# Patient Record
Sex: Male | Born: 1970 | Race: White | Hispanic: No | Marital: Single | Smoking: Former smoker
Health system: Southern US, Community
[De-identification: ages and names within clinical notes are randomized; demographics above are authoritative.]

## PROBLEM LIST (undated history)

## (undated) DIAGNOSIS — F32A Depression, unspecified: Secondary | ICD-10-CM

## (undated) DIAGNOSIS — F419 Anxiety disorder, unspecified: Secondary | ICD-10-CM

## (undated) DIAGNOSIS — A159 Respiratory tuberculosis unspecified: Secondary | ICD-10-CM

## (undated) DIAGNOSIS — M109 Gout, unspecified: Secondary | ICD-10-CM

## (undated) DIAGNOSIS — T7840XA Allergy, unspecified, initial encounter: Secondary | ICD-10-CM

## (undated) DIAGNOSIS — M1990 Unspecified osteoarthritis, unspecified site: Secondary | ICD-10-CM

## (undated) DIAGNOSIS — I10 Essential (primary) hypertension: Secondary | ICD-10-CM

## (undated) DIAGNOSIS — F329 Major depressive disorder, single episode, unspecified: Secondary | ICD-10-CM

## (undated) HISTORY — DX: Respiratory tuberculosis unspecified: A15.9

## (undated) HISTORY — PX: FRACTURE SURGERY: SHX138

## (undated) HISTORY — DX: Anxiety disorder, unspecified: F41.9

## (undated) HISTORY — DX: Allergy, unspecified, initial encounter: T78.40XA

## (undated) HISTORY — DX: Unspecified osteoarthritis, unspecified site: M19.90

---

## 2009-01-17 ENCOUNTER — Ambulatory Visit: Payer: Self-pay | Admitting: Family Medicine

## 2009-01-17 DIAGNOSIS — F411 Generalized anxiety disorder: Secondary | ICD-10-CM | POA: Insufficient documentation

## 2009-01-17 DIAGNOSIS — F1020 Alcohol dependence, uncomplicated: Secondary | ICD-10-CM | POA: Insufficient documentation

## 2009-01-17 DIAGNOSIS — I10 Essential (primary) hypertension: Secondary | ICD-10-CM | POA: Insufficient documentation

## 2009-01-23 ENCOUNTER — Telehealth: Payer: Self-pay | Admitting: Family Medicine

## 2009-01-23 ENCOUNTER — Ambulatory Visit: Payer: Self-pay | Admitting: Family Medicine

## 2009-02-13 ENCOUNTER — Ambulatory Visit: Payer: Self-pay | Admitting: Family Medicine

## 2009-03-22 ENCOUNTER — Telehealth: Payer: Self-pay | Admitting: Family Medicine

## 2010-03-27 NOTE — Progress Notes (Signed)
Summary: Atenolol and Clonazepam refill  Phone Note Refill Request Message from:  Patient on March 22, 2009 9:20 AM  Refills Requested: Medication #1:  ATENOLOL 50 MG TABS 1 tab by mouth once daily.  Medication #2:  CLONAZEPAM 1 MG TABS 1.5 tab by mouth at bedtime Initial call taken by: Payton Spark CMA,  March 22, 2009 9:20 AM    Prescriptions: CLONAZEPAM 1 MG TABS (CLONAZEPAM) 1.5 tab by mouth at bedtime  #45 x 0   Entered and Authorized by:   Seymour Bars DO   Signed by:   Seymour Bars DO on 03/22/2009   Method used:   Printed then faxed to ...       [address] [phone]* (retail)       [address] [place], [place]  [address]       Ph: [phone]       Fax: [phone]   RxID:   [id number] ATENOLOL 50 MG TABS (ATENOLOL) 1 tab by mouth once daily  #30 x 1   Entered and Authorized by:   Seymour Bars DO   Signed by:   Seymour Bars DO on 03/22/2009   Method used:   Printed then faxed to ...       [address] [phone]* (retail)       [address] [place], [place]  [address]       Ph: [phone]       Fax: [phone]   RxID:   [id number]

## 2017-09-05 ENCOUNTER — Encounter (HOSPITAL_COMMUNITY): Payer: Self-pay | Admitting: Emergency Medicine

## 2017-09-05 ENCOUNTER — Emergency Department (HOSPITAL_COMMUNITY)
Admission: EM | Admit: 2017-09-05 | Discharge: 2017-09-06 | Disposition: A | Discharge status: Other Acute Inpatient Hospital | Payer: Self-pay | Attending: Emergency Medicine | Admitting: Emergency Medicine

## 2017-09-05 DIAGNOSIS — R45851 Suicidal ideations: Secondary | ICD-10-CM | POA: Insufficient documentation

## 2017-09-05 DIAGNOSIS — F411 Generalized anxiety disorder: Secondary | ICD-10-CM | POA: Insufficient documentation

## 2017-09-05 DIAGNOSIS — F322 Major depressive disorder, single episode, severe without psychotic features: Secondary | ICD-10-CM | POA: Insufficient documentation

## 2017-09-05 DIAGNOSIS — I10 Essential (primary) hypertension: Secondary | ICD-10-CM | POA: Insufficient documentation

## 2017-09-05 HISTORY — DX: Gout, unspecified: M10.9

## 2017-09-05 HISTORY — DX: Essential (primary) hypertension: I10

## 2017-09-05 NOTE — ED Triage Notes (Addendum)
Pt arrives with police. Not speaking much in triage. Voluntary, suicidal at this time - reports feels this way for "some time." States "my life keeps getting worse and worse." Police report family/finance issues. States that he wants to drive his motorbike off an overpass but "I don't have the stones to do it."

## 2017-09-05 NOTE — ED Notes (Signed)
Pt belonging placed in locker 6 in pod F

## 2017-09-05 NOTE — ED Notes (Signed)
Pt changed into burgundy scrubs.  Staffing office notified of BH sitter need.  Belongings placed in locker by Robin, EMT.  Charge RN aware of pt.  Security at triage to wand pt. 

## 2017-09-05 NOTE — ED Notes (Signed)
Provided burgundy scrubs at this time, pt getting changed. Police at bedside

## 2017-09-06 ENCOUNTER — Other Ambulatory Visit: Payer: Self-pay

## 2017-09-06 ENCOUNTER — Encounter (HOSPITAL_COMMUNITY): Payer: Self-pay | Admitting: Nurse Practitioner

## 2017-09-06 ENCOUNTER — Inpatient Hospital Stay (HOSPITAL_COMMUNITY)
Admission: AD | Admit: 2017-09-06 | Discharge: 2017-09-15 | DRG: 885 | Disposition: A | Discharge status: Home / Self Care | Payer: Federal, State, Local not specified - Other | Source: Intra-hospital | Attending: Psychiatry | Admitting: Psychiatry

## 2017-09-06 DIAGNOSIS — I10 Essential (primary) hypertension: Secondary | ICD-10-CM | POA: Diagnosis present

## 2017-09-06 DIAGNOSIS — G4700 Insomnia, unspecified: Secondary | ICD-10-CM | POA: Diagnosis present

## 2017-09-06 DIAGNOSIS — T43215A Adverse effect of selective serotonin and norepinephrine reuptake inhibitors, initial encounter: Secondary | ICD-10-CM | POA: Diagnosis not present

## 2017-09-06 DIAGNOSIS — R5383 Other fatigue: Secondary | ICD-10-CM | POA: Diagnosis not present

## 2017-09-06 DIAGNOSIS — Y92230 Patient room in hospital as the place of occurrence of the external cause: Secondary | ICD-10-CM | POA: Diagnosis not present

## 2017-09-06 DIAGNOSIS — Z818 Family history of other mental and behavioral disorders: Secondary | ICD-10-CM | POA: Diagnosis not present

## 2017-09-06 DIAGNOSIS — Z599 Problem related to housing and economic circumstances, unspecified: Secondary | ICD-10-CM | POA: Diagnosis not present

## 2017-09-06 DIAGNOSIS — Z62819 Personal history of unspecified abuse in childhood: Secondary | ICD-10-CM | POA: Diagnosis present

## 2017-09-06 DIAGNOSIS — M79674 Pain in right toe(s): Secondary | ICD-10-CM | POA: Diagnosis not present

## 2017-09-06 DIAGNOSIS — F322 Major depressive disorder, single episode, severe without psychotic features: Secondary | ICD-10-CM | POA: Diagnosis present

## 2017-09-06 DIAGNOSIS — F4310 Post-traumatic stress disorder, unspecified: Secondary | ICD-10-CM | POA: Diagnosis present

## 2017-09-06 DIAGNOSIS — Z560 Unemployment, unspecified: Secondary | ICD-10-CM

## 2017-09-06 DIAGNOSIS — M109 Gout, unspecified: Secondary | ICD-10-CM | POA: Diagnosis present

## 2017-09-06 DIAGNOSIS — R45851 Suicidal ideations: Secondary | ICD-10-CM | POA: Diagnosis present

## 2017-09-06 DIAGNOSIS — R42 Dizziness and giddiness: Secondary | ICD-10-CM | POA: Diagnosis not present

## 2017-09-06 DIAGNOSIS — Z91018 Allergy to other foods: Secondary | ICD-10-CM

## 2017-09-06 DIAGNOSIS — K219 Gastro-esophageal reflux disease without esophagitis: Secondary | ICD-10-CM | POA: Diagnosis present

## 2017-09-06 DIAGNOSIS — Z91030 Bee allergy status: Secondary | ICD-10-CM | POA: Diagnosis not present

## 2017-09-06 DIAGNOSIS — R52 Pain, unspecified: Secondary | ICD-10-CM

## 2017-09-06 DIAGNOSIS — F411 Generalized anxiety disorder: Secondary | ICD-10-CM | POA: Diagnosis present

## 2017-09-06 DIAGNOSIS — F332 Major depressive disorder, recurrent severe without psychotic features: Secondary | ICD-10-CM | POA: Diagnosis present

## 2017-09-06 DIAGNOSIS — F1020 Alcohol dependence, uncomplicated: Secondary | ICD-10-CM | POA: Diagnosis present

## 2017-09-06 LAB — COMPREHENSIVE METABOLIC PANEL
ALBUMIN: 4.7 g/dL (ref 3.5–5.0)
ALK PHOS: 90 U/L (ref 38–126)
ALT: 63 U/L — AB (ref 0–44)
AST: 38 U/L (ref 15–41)
Anion gap: 11 (ref 5–15)
BUN: 9 mg/dL (ref 6–20)
CALCIUM: 9.5 mg/dL (ref 8.9–10.3)
CHLORIDE: 106 mmol/L (ref 98–111)
CO2: 24 mmol/L (ref 22–32)
CREATININE: 1.14 mg/dL (ref 0.61–1.24)
GFR calc non Af Amer: >60 mL/min (ref >60)
GLUCOSE: 109 mg/dL — AB (ref 70–99)
Potassium: 3.7 mmol/L (ref 3.5–5.1)
SODIUM: 141 mmol/L (ref 135–145)
Total Bilirubin: 1.6 mg/dL — ABNORMAL HIGH (ref 0.3–1.2)
Total Protein: 7.3 g/dL (ref 6.5–8.1)

## 2017-09-06 LAB — RAPID URINE DRUG SCREEN, HOSP PERFORMED
AMPHETAMINES: NOT DETECTED
Benzodiazepines: NOT DETECTED
COCAINE: NOT DETECTED
OPIATES: NOT DETECTED
TETRAHYDROCANNABINOL: NOT DETECTED

## 2017-09-06 LAB — CBC
HEMATOCRIT: 44.5 % (ref 39.0–52.0)
HEMOGLOBIN: 15 g/dL (ref 13.0–17.0)
MCH: 30.4 pg (ref 26.0–34.0)
MCHC: 33.7 g/dL (ref 30.0–36.0)
MCV: 90.3 fL (ref 78.0–100.0)
Platelets: 197 10*3/uL (ref 150–400)
RBC: 4.93 MIL/uL (ref 4.22–5.81)
RDW: 12.8 % (ref 11.5–15.5)
WBC: 7 10*3/uL (ref 4.0–10.5)

## 2017-09-06 LAB — SALICYLATE LEVEL

## 2017-09-06 LAB — ETHANOL: Alcohol, Ethyl (B): <10 mg/dL (ref <10)

## 2017-09-06 LAB — ACETAMINOPHEN LEVEL

## 2017-09-06 MED ORDER — ADULT MULTIVITAMIN W/MINERALS CH
1.0000 | ORAL_TABLET | Freq: Every day | ORAL | Status: DC
  Administered 2017-09-06 – 2017-09-14 (×8): 1 via ORAL
  Filled 2017-09-06 (×13): qty 1

## 2017-09-06 MED ORDER — CHLORDIAZEPOXIDE HCL 25 MG PO CAPS: 25.0000 mg | ORAL_CAPSULE | Freq: Four times a day (QID) | ORAL | Status: AC | PRN

## 2017-09-06 MED ORDER — HYDROXYZINE HCL 25 MG PO TABS: 25.0000 mg | ORAL_TABLET | Freq: Four times a day (QID) | ORAL | Status: DC | PRN

## 2017-09-06 MED ORDER — ONDANSETRON 4 MG PO TBDP: 4.0000 mg | ORAL_TABLET | Freq: Four times a day (QID) | ORAL | Status: AC | PRN

## 2017-09-06 MED ORDER — VITAMIN B-1 100 MG PO TABS
100.0000 mg | ORAL_TABLET | Freq: Every day | ORAL | Status: DC
  Administered 2017-09-07 – 2017-09-14 (×7): 100 mg via ORAL
  Filled 2017-09-06 (×11): qty 1

## 2017-09-06 MED ORDER — TRAZODONE HCL 50 MG PO TABS
50.0000 mg | ORAL_TABLET | Freq: Every evening | ORAL | Status: DC | PRN
  Administered 2017-09-06: 50 mg via ORAL
  Filled 2017-09-06: qty 1

## 2017-09-06 MED ORDER — HYDROXYZINE HCL 25 MG PO TABS
25.0000 mg | ORAL_TABLET | Freq: Four times a day (QID) | ORAL | Status: AC | PRN
  Administered 2017-09-08: 25 mg via ORAL
  Filled 2017-09-06: qty 1

## 2017-09-06 MED ORDER — FLUOXETINE HCL 20 MG PO CAPS
20.0000 mg | ORAL_CAPSULE | Freq: Every day | ORAL | Status: DC
  Administered 2017-09-07: 20 mg via ORAL
  Filled 2017-09-06 (×3): qty 1

## 2017-09-06 MED ORDER — LOPERAMIDE HCL 2 MG PO CAPS: 2.0000 mg | ORAL_CAPSULE | ORAL | Status: AC | PRN

## 2017-09-06 MED ORDER — LISINOPRIL 10 MG PO TABS
10.0000 mg | ORAL_TABLET | Freq: Every day | ORAL | Status: DC
  Administered 2017-09-06 – 2017-09-07 (×2): 10 mg via ORAL
  Filled 2017-09-06: qty 1
  Filled 2017-09-06: qty 2
  Filled 2017-09-06 (×3): qty 1

## 2017-09-06 NOTE — Progress Notes (Addendum)
Per Julieanne Cottonina AC, [facility] Accepting provider is Donell SievertSpencer Simon. Attending provider is [place]. Patient can arrive by 1 PM. Number for report is [phone]. LCSW has notified patient's nurse of placement plans.   Moss McKy-Sha Thedora Rings MSW, LCSW, LCAS Clinical Social Worker 09/06/2017 8:07 AM

## 2017-09-06 NOTE — Progress Notes (Signed)
Per Donell SievertSpencer Simon, PA pt is recommended for inpt treatment. EDP Dr. Judd Lienelo, MD and pt's nurse Marylene LandAngela, RN have been advised of the disposition.  [facility].  Princess BruinsAquicha Khian Remo, MSW, LCSW Therapeutic Triage Specialist  [phone]

## 2017-09-06 NOTE — Progress Notes (Signed)
The patient would only share in group that he had a good first day in the hospital. Appears to be in a good mood. His goal for tomorrow is to work on "getting things off of my chest".
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
## 2017-09-06 NOTE — ED Notes (Addendum)
Pt requested to be listed as confidential - Registration aware. Encouraging pt to eat breakfast - states is not hungry.
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
## 2017-09-06 NOTE — ED Provider Notes (Signed)
[facility] EMERGENCY DEPARTMENT Provider Note   CSN: [id number] Arrival date & time: 09/05/17  2250     History   Chief Complaint Chief Complaint  Patient presents with  . Suicidal    HPI James Fisher is a 47 y.o. male.  Patient presents to the emergency department with chief complaint of suicidal ideation.  He states that he is very depressed and does not want to be a part of this world.  He states that the things that he sees on the news or depressing and distressing to him.  He states that people are around him are dying who want to live, and he states that he does not have the courage to kill himself and he wants to die.  He denies drug or alcohol use, but does have a history of alcoholism.  He states that he has felt like this for some time.  He does not have a clear plan on how he would kill himself.  He denies hearing or seeing things.  The history is provided by the patient. No language interpreter was used.    Past Medical History:  Diagnosis Date  . Gout   . Hypertension     Patient Active Problem List   Diagnosis Date Noted  . ANXIETY DISORDER 01/17/2009  . ALCOHOLISM 01/17/2009  . ESSENTIAL HYPERTENSION, BENIGN 01/17/2009    History reviewed. No pertinent surgical history.      Home Medications    Prior to Admission medications   Not on File    Family History No family history on file.  Social History Social History   Tobacco Use  . Smoking status: Never Smoker  . Smokeless tobacco: Never Used  Substance Use Topics  . Alcohol use: Yes    Comment: 50-60 beers a week  . Drug use: Not Currently     Allergies   Bee venom   Review of Systems Review of Systems  All other systems reviewed and are negative.    Physical Exam Updated Vital Signs BP (!) 171/118 (BP Location: Right Arm)   Pulse 87   Temp 98.7 F (37.1 C) (Oral)   Resp 18   SpO2 95%   Physical Exam  Constitutional: He is oriented to person, place, and  time. He appears well-developed and well-nourished.  HENT:  Head: Normocephalic and atraumatic.  Eyes: Pupils are equal, round, and reactive to light. Conjunctivae and EOM are normal. Right eye exhibits no discharge. Left eye exhibits no discharge. No scleral icterus.  Neck: Normal range of motion. Neck supple. No JVD present.  Cardiovascular: Normal rate, regular rhythm and normal heart sounds. Exam reveals no gallop and no friction rub.  No murmur heard. Pulmonary/Chest: Effort normal and breath sounds normal. No respiratory distress. He has no wheezes. He has no rales. He exhibits no tenderness.  Abdominal: Soft. He exhibits no distension and no mass. There is no tenderness. There is no rebound and no guarding.  Musculoskeletal: Normal range of motion. He exhibits no edema or tenderness.  Neurological: He is alert and oriented to person, place, and time.  Skin: Skin is warm and dry.  Psychiatric: He has a normal mood and affect. His behavior is normal. Judgment and thought content normal.  Nursing note and vitals reviewed.    ED Treatments / Results  Labs (all labs ordered are listed, but only abnormal results are displayed) Labs Reviewed  COMPREHENSIVE METABOLIC PANEL - Abnormal; Notable for the following components:  Result Value   Glucose, Bld 109 (*)    ALT 63 (*)    Total Bilirubin 1.6 (*)    All other components within normal limits  ACETAMINOPHEN LEVEL - Abnormal; Notable for the following components:   Acetaminophen (Tylenol), Serum <10 (*)    All other components within normal limits  ETHANOL  SALICYLATE LEVEL  CBC  RAPID URINE DRUG SCREEN, HOSP PERFORMED    EKG None  Radiology No results found.  Procedures Procedures (including critical care time)  Medications Ordered in ED Medications - No data to display   Initial Impression / Assessment and Plan / ED Course  I have reviewed the triage vital signs and the nursing notes.  Pertinent labs & imaging  results that were available during my care of the patient were reviewed by me and considered in my medical decision making (see chart for details).     Patient with suicidal thoughts.  States that he feels very depressed with off the sad and unfortunate world events that he sees around him and on the news.  He states that this caused him to want to kill himself.  TTS recommends inpatient treatment.  Final Clinical Impressions(s) / ED Diagnoses   Final diagnoses:  Suicidal ideation    ED Discharge Orders    None       Roxy HorsemanBrowning, Karly Pitter, PA-C 09/06/17 0444    Azalia Bilisampos, Kevin, MD 09/06/17 [phone]

## 2017-09-06 NOTE — ED Notes (Signed)
ALL belongings - 1 labeled belongings bag - [place]. Pt aware.  
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
## 2017-09-06 NOTE — ED Notes (Addendum)
Pt voiced understanding and agreement w/tx plan - [facility] - signed consent forms - [facility], copy sent to Medical Records, and [facility]. Pt declining to eat lunch - drinking po fluids well.
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
## 2017-09-07 LAB — TSH: TSH: 2.381 u[IU]/mL (ref 0.350–4.500)

## 2017-09-07 MED ORDER — FLUOXETINE HCL 10 MG PO CAPS
10.0000 mg | ORAL_CAPSULE | Freq: Every day | ORAL | Status: DC
  Administered 2017-09-08: 10 mg via ORAL
  Filled 2017-09-07 (×2): qty 1

## 2017-09-07 MED ORDER — LISINOPRIL 5 MG PO TABS
5.0000 mg | ORAL_TABLET | Freq: Every day | ORAL | Status: DC
  Administered 2017-09-08 – 2017-09-14 (×6): 5 mg via ORAL
  Filled 2017-09-07 (×9): qty 1

## 2017-09-07 MED ORDER — TRAZODONE HCL 100 MG PO TABS
100.0000 mg | ORAL_TABLET | Freq: Every evening | ORAL | Status: DC | PRN
  Administered 2017-09-07 – 2017-09-10 (×4): 100 mg via ORAL
  Filled 2017-09-07 (×4): qty 1

## 2017-09-07 NOTE — [facility] Group Notes (Signed)
[facility] Group Notes: (Clinical Social Work)   09/07/2017      Type of Therapy:  Group Therapy   Participation Level:  Did Not Attend despite MHT prompting   Ambrose MantleMareida Grossman-Orr, LCSW 09/07/2017, 2:03 PM

## 2017-09-07 NOTE — Progress Notes (Signed)
Patient has been up and active on the unit, attended group this evening and has voiced no complaints. Patient currently denies having pain, -si/hi/a/v hall. Support and encouragement offered, safety maintained on unit, will continue to monitor.  

## 2017-09-07 NOTE — Progress Notes (Signed)
D: Patient observed in dayroom interacting with his peers.  He denies any thoughts of self harm.  Patient concerned about his BP this morning and it was retaken.  Advised him to let staff know if he experience any dizziness or lightheadedness.  Patient can be jovial at times.  He rates his depression and anxiety as a 3; hopelessness as an 8.  Patient is concerned about where he will discharge to as he cannot stay with his sister any longer.  A: Continue to monitor medication management and MD orders.  Safety checks completed every 15 minutes per protocol.  Offer support and encouragement as needed.  R: Patient is receptive to staff; his behavior is appropriate.
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
## 2017-09-07 NOTE — Progress Notes (Signed)
Writer spoke with patient 1:1. He reports being admitted to the unit this afternoon and is getting used to the routine. Writer informed him of medications available if need. He did request medication to aid with sleep later on before going to bed. He has been up in the dayroom watching tv with minimal interaction with peers. Support given and safety maintained on unit with 15 min checks

## 2017-09-07 NOTE — Progress Notes (Signed)
Patient states, "I cannot take that medication.  I don't like the way it makes me feel."  Patient was complaining about taking his nighttime medication too late and he states, "I only got 5 hours sleep."  He appears irritable and upset over his medications.
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
## 2017-09-07 NOTE — Plan of Care (Signed)
  Problem: Education: Goal: Verbalization of understanding the information provided will improve Outcome: Progressing   Problem: Coping: Goal: Ability to verbalize frustrations and anger appropriately will improve Outcome: Progressing   Problem: Activity: Goal: Sleeping patterns will improve Outcome: Not Progressing

## 2017-09-08 MED ORDER — FLUOXETINE HCL 20 MG PO CAPS
20.0000 mg | ORAL_CAPSULE | Freq: Every day | ORAL | Status: DC
  Administered 2017-09-09 – 2017-09-12 (×4): 20 mg via ORAL
  Filled 2017-09-08 (×6): qty 1

## 2017-09-08 NOTE — [facility] Group Notes (Signed)
[facility] LCSW Group Therapy Note  Date/Time: 09/08/17, 1315  Type of Therapy and Topic:  Group Therapy:  Overcoming Obstacles  Participation Level:  moderate  Description of Group:    In this group patients will be encouraged to explore what they see as obstacles to their own wellness and recovery. They will be guided to discuss their thoughts, feelings, and behaviors related to these obstacles. The group will process together ways to cope with barriers, with attention given to specific choices patients can make. Each patient will be challenged to identify changes they are motivated to make in order to overcome their obstacles. This group will be process-oriented, with patients participating in exploration of their own experiences as well as giving and receiving support and challenge from other group members.  Therapeutic Goals: 1. Patient will identify personal and current obstacles as they relate to admission. 2. Patient will identify barriers that currently interfere with their wellness or overcoming obstacles.  3. Patient will identify feelings, thought process and behaviors related to these barriers. 4. Patient will identify two changes they are willing to make to overcome these obstacles:    Summary of Patient Progress: Pt initially declined to identify any obstacles and later said that he had many of the same obstacles as had been identified by other group members and that his "list was too long".  Pt did participate in the group discussion somewhat regarding ways to take steps towards overcoming obstacles.      Therapeutic Modalities:   Cognitive Behavioral Therapy Solution Focused Therapy Motivational Interviewing Relapse Prevention Therapy  Daleen SquibbGreg Delynn Pursley, LCSW
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
## 2017-09-08 NOTE — Plan of Care (Signed)
  Problem: Coping: Goal: Ability to demonstrate self-control will improve Outcome: Progressing Note:  Pt has remained in control of his behavior tonight.

## 2017-09-08 NOTE — Progress Notes (Signed)
D: Pt was in dayroom upon initial approach.  Pt presents with anxious affect and mood.  His goal today was to "clear my head."  He reports his day "started out rough, but it got better."  Pt denies SI/HI, denies hallucinations, denies pain.  Pt has been visible in milieu interacting with peers and staff appropriately.  Pt attended evening group.    A: Introduced self to pt.  Actively listened to pt and offered support and encouragement. PRN medication administered for anxiety and sleep.  Q15 minute safety checks maintained.  R: Pt is safe on the unit.  Pt is compliant with medications.  Pt verbally contracts for safety.  Will continue to monitor and assess.

## 2017-09-08 NOTE — Tx Team (Signed)
Interdisciplinary Treatment and Diagnostic Plan Update  09/08/2017 Time of Session: 1055 James Fisher MRN: [id number]  Principal Diagnosis: <principal problem not specified>  Secondary Diagnoses: Active Problems:   MDD (major depressive disorder), severe (HCC)   Current Medications:  Current Facility-Administered Medications  Medication Dose Route Frequency Provider Last Rate Last Dose  . chlordiazePOXIDE (LIBRIUM) capsule 25 mg  25 mg Oral Q6H PRN Cobos, Fernando A, MD      . FLUoxetine (PROZAC) capsule 10 mg  10 mg Oral Daily Cobos, Rockey SituFernando A, MD   10 mg at 09/08/17 0813  . hydrOXYzine (ATARAX/VISTARIL) tablet 25 mg  25 mg Oral Q6H PRN Cobos, Fernando A, MD      . lisinopril (PRINIVIL,ZESTRIL) tablet 5 mg  5 mg Oral Daily Cobos, Rockey SituFernando A, MD   5 mg at 09/08/17 0813  . loperamide (IMODIUM) capsule 2-4 mg  2-4 mg Oral PRN Cobos, Rockey SituFernando A, MD      . multivitamin with minerals tablet 1 tablet  1 tablet Oral Daily Cobos, Rockey SituFernando A, MD   1 tablet at 09/08/17 0813  . ondansetron (ZOFRAN-ODT) disintegrating tablet 4 mg  4 mg Oral Q6H PRN Cobos, Fernando A, MD      . thiamine (VITAMIN B-1) tablet 100 mg  100 mg Oral Daily Cobos, Rockey SituFernando A, MD   100 mg at 09/08/17 0813  . traZODone (DESYREL) tablet 100 mg  100 mg Oral QHS PRN Cobos, Rockey SituFernando A, MD   100 mg at 09/07/17 2134   PTA Medications: No medications prior to admission.    Patient Stressors:    Patient Strengths:    Treatment Modalities: Medication Management, Group therapy, Case management,  1 to 1 session with clinician, Psychoeducation, Recreational therapy.   Physician Treatment Plan for Primary Diagnosis: <principal problem not specified> Long Term Goal(s): Improvement in symptoms so as ready for discharge Improvement in symptoms so as ready for discharge   Short Term Goals: Ability to identify changes in lifestyle to reduce recurrence of condition will improve Ability to maintain clinical measurements within  normal limits will improve Ability to identify changes in lifestyle to reduce recurrence of condition will improve Ability to verbalize feelings will improve Ability to disclose and discuss suicidal ideas Ability to demonstrate self-control will improve Ability to identify and develop effective coping behaviors will improve Ability to maintain clinical measurements within normal limits will improve  Medication Management: Evaluate patient's response, side effects, and tolerance of medication regimen.  Therapeutic Interventions: 1 to 1 sessions, Unit Group sessions and Medication administration.  Evaluation of Outcomes: Progressing  Physician Treatment Plan for Secondary Diagnosis: Active Problems:   MDD (major depressive disorder), severe (HCC)  Long Term Goal(s): Improvement in symptoms so as ready for discharge Improvement in symptoms so as ready for discharge   Short Term Goals: Ability to identify changes in lifestyle to reduce recurrence of condition will improve Ability to maintain clinical measurements within normal limits will improve Ability to identify changes in lifestyle to reduce recurrence of condition will improve Ability to verbalize feelings will improve Ability to disclose and discuss suicidal ideas Ability to demonstrate self-control will improve Ability to identify and develop effective coping behaviors will improve Ability to maintain clinical measurements within normal limits will improve     Medication Management: Evaluate patient's response, side effects, and tolerance of medication regimen.  Therapeutic Interventions: 1 to 1 sessions, Unit Group sessions and Medication administration.  Evaluation of Outcomes: Progressing   RN Treatment Plan for Primary Diagnosis: <principal problem  not specified> Long Term Goal(s): Knowledge of disease and therapeutic regimen to maintain health will improve  Short Term Goals: Ability to identify and develop effective  coping behaviors will improve and Compliance with prescribed medications will improve  Medication Management: RN will administer medications as ordered by provider, will assess and evaluate patient's response and provide education to patient for prescribed medication. RN will report any adverse and/or side effects to prescribing provider.  Therapeutic Interventions: 1 on 1 counseling sessions, Psychoeducation, Medication administration, Evaluate responses to treatment, Monitor vital signs and CBGs as ordered, Perform/monitor CIWA, COWS, AIMS and Fall Risk screenings as ordered, Perform wound care treatments as ordered.  Evaluation of Outcomes: Progressing   LCSW Treatment Plan for Primary Diagnosis: <principal problem not specified> Long Term Goal(s): Safe transition to appropriate next level of care at discharge, Engage patient in therapeutic group addressing interpersonal concerns.  Short Term Goals: Engage patient in aftercare planning with referrals and resources, Increase social support and Increase skills for wellness and recovery  Therapeutic Interventions: Assess for all discharge needs, 1 to 1 time with Social worker, Explore available resources and support systems, Assess for adequacy in community support network, Educate family and significant other(s) on suicide prevention, Complete Psychosocial Assessment, Interpersonal group therapy.  Evaluation of Outcomes: Progressing   Progress in Treatment: Attending groups: No. Participating in groups: No. Taking medication as prescribed: Yes. Toleration medication: Yes. Family/Significant other contact made: No, will contact:  pt declined consent Patient understands diagnosis: Yes. Discussing patient identified problems/goals with staff: Yes. Medical problems stabilized or resolved: Yes. Denies suicidal/homicidal ideation: Yes. Issues/concerns per patient self-inventory: No. Other: none  New problem(s) identified: No, Describe:   none  New Short Term/Long Term Goal(s):  Patient Goals:  "To try to figure out why things have happened in my life."  Discharge Plan or Barriers:   Reason for Continuation of Hospitalization: Depression Medication stabilization  Estimated Length of Stay: 3-5 days. Attendees: Patient:James Fisher 09/08/2017   Physician: Dr Jama Flavors, MD 09/08/2017   Nursing: Nadean Corwin, RN 09/08/2017   RN Care Manager: 09/08/2017   Social Worker: Daleen Squibb, LCSW 09/08/2017   Recreational Therapist:  09/08/2017   Other:  09/08/2017   Other:  09/08/2017   Other: 09/08/2017         Scribe for Treatment Team: Lorri Frederick, LCSW 09/08/2017 12:03 PM

## 2017-09-08 NOTE — Progress Notes (Signed)
Patient ID: [name], male   DOB: [birth year], 47 y.o.   MRN: [id number]   Patient reports that he has low energy due to poor sleep. Rated his depression 7 out of 10. Also has reported helplessness and anxiety today. Affect blunted. Denies SI and HI and AVH.  A: Patient given emotional support from RN. Patient given medications per MD orders. Patient encouraged to attend groups and unit activities. Patient encouraged to come to staff with any questions or concerns.  R: Patient remains cooperative and appropriate. Will continue to monitor patient for safety.

## 2017-09-08 NOTE — Progress Notes (Signed)
Recreation Therapy Notes  Date: 7.15.19 Time: 0930 Location: [address] Dayroom  Group Topic: Stress Management  Goal Area(s) Addresses:  Patient will verbalize importance of using healthy stress management.  Patient will identify positive emotions associated with healthy stress management.   Intervention: Stress Management  Activity :  Guided Imagery.  LRT introduced the stress management technique of guided imagery.  LRT read Fisher script that allowed patients to picture their peaceful place.  Patients were to follow along as the script was read to engage in the activity.  Education:  Stress Management, Discharge Planning.   Education Outcome: Acknowledges edcuation/In group clarification offered/Needs additional education  Clinical Observations/Feedback: Pt did not attend group.      James Fisher, LRT/CTRS         James Fisher 09/08/2017 12:40 PM 

## 2017-09-09 MED ORDER — IBUPROFEN 400 MG PO TABS
400.0000 mg | ORAL_TABLET | Freq: Four times a day (QID) | ORAL | Status: DC | PRN
  Administered 2017-09-09 (×2): 400 mg via ORAL
  Filled 2017-09-09 (×2): qty 1

## 2017-09-09 MED ORDER — HYDROXYZINE HCL 25 MG PO TABS
25.0000 mg | ORAL_TABLET | Freq: Four times a day (QID) | ORAL | Status: DC | PRN
  Administered 2017-09-09 – 2017-09-11 (×3): 25 mg via ORAL
  Filled 2017-09-09 (×2): qty 1
  Filled 2017-09-09: qty 10
  Filled 2017-09-09: qty 1
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
## 2017-09-09 NOTE — Progress Notes (Signed)
Adult Psychoeducational Group Note  Date:  09/09/2017 Time:  8:52 PM  Group Topic/Focus:  Wrap-Up Group:   The focus of this group is to help patients review their daily goal of treatment and discuss progress on daily workbooks.  Participation Level:  Active  Participation Quality:  Appropriate  Affect:  Appropriate  Cognitive:  Alert  Insight: Appropriate  Engagement in Group:  Engaged  Modes of Intervention:  Discussion  Additional Comments:  Patient stated having a good day. Patient's goal for today was to work on relationships with certain family members.  Llana Deshazo L Yechezkel Fertig 09/09/2017, 8:52 PM

## 2017-09-09 NOTE — [facility] Group Notes (Signed)
[facility] LCSW Group Therapy Note  Date/Time: 09/09/17, 1315  Type of Therapy/Topic:  Group Therapy:  Feelings about Diagnosis  Participation Level:  Active   Mood:pleasant   Description of Group:    This group will allow patients to explore their thoughts and feelings about diagnoses they have received. Patients will be guided to explore their level of understanding and acceptance of these diagnoses. Facilitator will encourage patients to process their thoughts and feelings about the reactions of others to their diagnosis, and will guide patients in identifying ways to discuss their diagnosis with significant others in their lives. This group will be process-oriented, with patients participating in exploration of their own experiences as well as giving and receiving support and challenge from other group members.   Therapeutic Goals: 1. Patient will demonstrate understanding of diagnosis as evidence by identifying two or more symptoms of the disorder:  2. Patient will be able to express two feelings regarding the diagnosis 3. Patient will demonstrate ability to communicate their needs through discussion and/or role plays  Summary of Patient Progress:Pt active in group discussion regarding symptoms of bipolar disorder, stigma, and acceptance of mental health diagnosis.  Pt made several good comments.  Good participation.        Therapeutic Modalities:   Cognitive Behavioral Therapy Brief Therapy Feelings Identification   Greg Miski Feldpausch, LCSW 

## 2017-09-09 NOTE — [facility] Group Notes (Signed)
[facility] Group Notes:  (Nursing/MHT/Case Management/Adjunct)  Date:  09/09/2017  Time:  4:03 PM  Type of Therapy:  Nurse Education  Participation Level:  Active  Participation Quality:  Appropriate  Affect:  Appropriate  Cognitive:  Appropriate  Insight:  Appropriate  Engagement in Group:  Engaged  Modes of Intervention:  Discussion, Education, Socialization and Support  Summary of Progress/Problems: Pt participated in relaxation exercise group.    Maurine SimmeringShugart, Analyah Mcconnon M 09/09/2017, 4:03 PM

## 2017-09-09 NOTE — Progress Notes (Signed)
D: Pt was in the dayroom upon initial approach.  Pt presents with anxious affect and mood.  He smiles while interacting with others.  Pt reports his day was "actually really good compared to yesterday."  His goal is "working on finding the words to mend relationships with my family right now."  Pt denies SI/HI, denies hallucinations, reports pain from headache of 5/10.  Pt has been visible in milieu interacting with peers and staff appropriately.  Pt attended evening group.    A: Introduced self to pt.  Actively listened to pt and offered support and encouragement. PRN medication administered for pain, anxiety, and sleep.  Q15 minute safety checks maintained.  R: Pt is safe on the unit.  Pt is compliant with medications.  Pt verbally contracts for safety.  Will continue to monitor and assess.

## 2017-09-09 NOTE — Progress Notes (Signed)
Data: Responses from pt's self-inventory: Depression 2/10 Anxiety 8/10 Hopelessness 8/10 Sleep- fair  Appetite-good Energy-normal Pt's goal "rest".  Action:  Orders and medications reviewed with pt. V/S and labs reviewed. Verbal support provided. Pt encouraged to attend scheduled groups. Suicide risk assessment completed. 15-minute checks performed for safety.   Response: "I haven't slept well in three days. Last night they brought in a new pt and I couldn't go back to sleep". "I really need to sleep because it's affecting my mood"."The doctor made changes to my sleep medicine yesterday and I thought it would help last night, but I was awaken from my sleep, so it's hard to tell". Pt compliant with tx plan. Pt denies any side effects to medications.

## 2017-09-09 NOTE — Plan of Care (Signed)
  Problem: Activity: Goal: Sleeping patterns will improve Outcome: Not Progressing  Pt reports difficulty sleeping for the last three days. Pt reported sleep disturbance last night after a new pt was brought into the room.

## 2017-09-09 NOTE — Progress Notes (Signed)
Recreation Therapy Notes  Animal-Assisted Activity (AAA) Program Checklist/Progress Notes Patient Eligibility Criteria Checklist & Daily Group note for Rec Tx Intervention  Date: 7.16.19 Time: 1430 [address]   AAA/T Program Assumption of Risk Form signed by Engineer, productionatient/ or Parent Legal Guardian YES   Patient is free of allergies or sever asthma YES   Patient reports no fear of animals YES   Patient reports no history of cruelty to animals YES   Patient understands his/her participation is voluntary YES   Patient washes hands before animal contact  YES  Patient washes hands after animal contact  YES   Behavioral Response: Engaged  Education: Charity fundraiserHand Washing, Appropriate Animal Interaction   Education Outcome: Acknowledges understanding/In group clarification offered/Needs additional education.   Clinical Observations/Feedback: Pt attended activity.    Caroll RancherMarjette Koven Belinsky, LRT/CTRS          Caroll RancherLindsay, La Shehan A 09/09/2017 2:59 PM

## 2017-09-10 ENCOUNTER — Encounter (HOSPITAL_COMMUNITY): Payer: Self-pay | Admitting: Behavioral Health

## 2017-09-10 NOTE — Progress Notes (Signed)
Adult Psychoeducational Group Note  Date:  09/10/2017 Time:  9:55 PM  Group Topic/Focus:  Wrap-Up Group:   The focus of this group is to help patients review their daily goal of treatment and discuss progress on daily workbooks.  Participation Level:  Active  Participation Quality:  Appropriate  Affect:  Appropriate  Cognitive:  Alert and Oriented  Insight: Appropriate  Engagement in Group:  Developing/Improving  Modes of Intervention:  Exploration and Support  Additional Comments:  Pt verbalized that his day started as a 1 but ended as a 8 or 9. Pt verbalized that something positive is being able to talk to everyone hear and bring his anxiety down.  Mouhamed Glassco, Randal Bubaerri Lee 09/10/2017, 9:55 PM

## 2017-09-10 NOTE — Therapy (Signed)
Occupational Therapy Group Note  Date:  09/10/2017 Time:  2:54 PM  Group Topic/Focus:  Stress Management  Participation Level:  Active  Participation Quality:  Appropriate  Affect:  Flat  Cognitive:  Appropriate  Insight: Improving  Engagement in Group:  Engaged  Modes of Intervention:  Activity, Discussion, Education and Socialization  Additional Comments:   S: "I like to watch youtube videos and cook"   O: Education given on stress management and healthy coping mechanisms. Pt encouraged to brainstorm with other peers and discuss what has worked in the past vs what has not. Pts further encouraged to discuss new coping stress management strategies to implement this date. Art activity made to display preferred coping mechanisms, along with incorporating the stress management outlet of coloring/art. PMR script delivered to facilitate relaxation response to help significant engagement in BADL/IADL.   A: Pt presents to group with flat affect, affect improved when discussing preferred activities. Pt brainstormed activities, needing mod-max VC's to pick 8 preferred activities. Encouraged pt to ask assistance from other peers, pt then stating watching youtube videos and cooking is his most preferred activities. Pt engaged in art activity with success. Pt enjoyed PMR script with increased relaxation response.   P: Pt provided with education on stress management activities to implement into daily routine. Handouts given to facilitate carryover when reintegrating into community.     Dalphine HandingKaylee Dyllin Gulley, MSOT, OTR/L  [place] Beach[place]lee Marijane Trower 09/10/2017, 2:54 PM

## 2017-09-10 NOTE — [facility] Group Notes (Signed)
[facility] Group Therapy 09/10/2017 1:15pm  Type of Therapy: Mental Health Association Presentation  Participation Level: Active  Participation Quality: Attentive  Affect: Appropriate  Cognitive: Oriented  Insight: Developing/Improving  Engagement in Therapy: Engaged  Modes of Intervention: Discussion, Education and Socialization  Summary of Progress/Problems: Mental Health Association (MHA) Speaker came to talk about his personal journey with mental health. The pt processed ways by which to relate to the speaker. MHA speaker provided handouts and educational information pertaining to groups and services offered by the MHA. Pt was engaged in speaker's presentation and was receptive to resources provided.    Tijuana Scheidegger Jon, LCSW 09/10/2017 12:46 PM 
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
## 2017-09-11 MED ORDER — PANTOPRAZOLE SODIUM 40 MG PO TBEC
40.0000 mg | DELAYED_RELEASE_TABLET | Freq: Every day | ORAL | Status: DC
  Administered 2017-09-11 – 2017-09-14 (×2): 40 mg via ORAL
  Filled 2017-09-11 (×5): qty 1
  Filled 2017-09-11 (×2): qty 7
  Filled 2017-09-11: qty 1

## 2017-09-11 MED ORDER — IBUPROFEN 800 MG PO TABS
800.0000 mg | ORAL_TABLET | Freq: Four times a day (QID) | ORAL | Status: DC | PRN
  Administered 2017-09-11: 800 mg via ORAL
  Filled 2017-09-11: qty 1

## 2017-09-11 MED ORDER — TRAZODONE HCL 50 MG PO TABS
50.0000 mg | ORAL_TABLET | Freq: Every evening | ORAL | Status: DC | PRN
  Administered 2017-09-11: 50 mg via ORAL
  Filled 2017-09-11: qty 1

## 2017-09-11 NOTE — Progress Notes (Signed)
Pt did not attend goals and orientation group this morning  

## 2017-09-11 NOTE — [facility] Group Notes (Signed)
[facility] LCSW Group Therapy Note  Date/Time: 09/11/17, 1315  Type of Therapy/Topic:  Group Therapy:  Balance in Life  Participation Level:  active  Description of Group:    This group will address the concept of balance and how it feels and looks when one is unbalanced. Patients will be encouraged to process areas in their lives that are out of balance, and identify reasons for remaining unbalanced. Facilitators will guide patients utilizing problem- solving interventions to address and correct the stressor making their life unbalanced. Understanding and applying boundaries will be explored and addressed for obtaining  and maintaining a balanced life. Patients will be encouraged to explore ways to assertively make their unbalanced needs known to significant others in their lives, using other group members and facilitator for support and feedback.  Therapeutic Goals: 1. Patient will identify two or more emotions or situations they have that consume much of in their lives. 2. Patient will identify signs/triggers that life has become out of balance:  3. Patient will identify two ways to set boundaries in order to achieve balance in their lives:  4. Patient will demonstrate ability to communicate their needs through discussion and/or role plays  Summary of Patient Progress:Pt shared that finances and housing is currently consuming his life and that mental/emotional and work/school are areas that need boundaries.  Good participation in the group discussion.          Therapeutic Modalities:   Cognitive Behavioral Therapy Solution-Focused Therapy Assertiveness Training  Daleen SquibbGreg Zymire Turnbo, Kentucky[place]CSW

## 2017-09-11 NOTE — Progress Notes (Signed)
[facility] MD Progress Note  09/11/2017 2:53 PM James Fisher  MRN:  [id number] Subjective: Patient is seen and examined.  Patient's 47 year old male with a past psychiatric history significant for depression, anxiety as well is alcohol use disorder.  He seen in follow-up.  He stated he feels like the Prozac is helping his mood as well as anxiety.  He also stated it was helping his irritability.  He stated he had a situation this a.m. that he would have normally gotten upset about, but he was able to control it.  He also stated that he was sleeping better, but felt as though the trazodone was causing him to have a hangover in the morning.  He missed 1 of the groups this morning, and it was because of the lethargy from the trazodone.  We discussed options today.  He denied any suicidal ideation today.  He is considering calling his sister and asking her if he is able to return to her home at least in the short run.  This is provoking anxiety in him. Principal Problem: <principal problem not specified> Diagnosis:   Patient Active Problem List   Diagnosis Date Noted  . MDD (major depressive disorder), severe (HCC) [F32.2] 09/06/2017  . ANXIETY DISORDER [F41.1] 01/17/2009  . ALCOHOLISM [F10.20] 01/17/2009  . ESSENTIAL HYPERTENSION, BENIGN [I10] 01/17/2009   Total Time spent with patient: 20 minutes  Past Psychiatric History: See admission H&P  Past Medical History:  Past Medical History:  Diagnosis Date  . Gout   . Hypertension    History reviewed. No pertinent surgical history. Family History: History reviewed. No pertinent family history. Family Psychiatric  History: See admission H&P Social History:  Social History   Substance and Sexual Activity  Alcohol Use Yes   Comment: 50-60 beers a week     Social History   Substance and Sexual Activity  Drug Use Not Currently    Social History   Socioeconomic History  . Marital status: Single    Spouse name: Not on file  . Number of children:  Not on file  . Years of education: Not on file  . Highest education level: Not on file  Occupational History  . Not on file  Social Needs  . Financial resource strain: Not on file  . Food insecurity:    Worry: Not on file    Inability: Not on file  . Transportation needs:    Medical: Not on file    Non-medical: Not on file  Tobacco Use  . Smoking status: Never Smoker  . Smokeless tobacco: Never Used  Substance and Sexual Activity  . Alcohol use: Yes    Comment: 50-60 beers a week  . Drug use: Not Currently  . Sexual activity: Not on file  Lifestyle  . Physical activity:    Days per week: Not on file    Minutes per session: Not on file  . Stress: Not on file  Relationships  . Social connections:    Talks on phone: Not on file    Gets together: Not on file    Attends religious service: Not on file    Active member of club or organization: Not on file    Attends meetings of clubs or organizations: Not on file    Relationship status: Not on file  Other Topics Concern  . Not on file  Social History Narrative  . Not on file   Additional Social History:    History of alcohol / drug use?: No history  of alcohol / drug abuse                    Sleep: Good  Appetite:  Fair  Current Medications: Current Facility-Administered Medications  Medication Dose Route Frequency Provider Last Rate Last Dose  . FLUoxetine (PROZAC) capsule 20 mg  20 mg Oral Daily Cobos, Rockey Situ, MD   20 mg at 09/11/17 0811  . hydrOXYzine (ATARAX/VISTARIL) tablet 25 mg  25 mg Oral Q6H PRN Nira Conn A, NP   25 mg at 09/10/17 2045  . ibuprofen (ADVIL,MOTRIN) tablet 400 mg  400 mg Oral Q6H PRN Armandina Stammer I, NP   400 mg at 09/09/17 2153  . lisinopril (PRINIVIL,ZESTRIL) tablet 5 mg  5 mg Oral Daily Cobos, Rockey Situ, MD   5 mg at 09/11/17 4098  . multivitamin with minerals tablet 1 tablet  1 tablet Oral Daily Cobos, Rockey Situ, MD   1 tablet at 09/11/17 [phone]  . thiamine (VITAMIN B-1) tablet  100 mg  100 mg Oral Daily Cobos, Rockey Situ, MD   100 mg at 09/11/17 4782  . traZODone (DESYREL) tablet 50 mg  50 mg Oral QHS PRN Antonieta Pert, MD        Lab Results: No results found for this or any previous visit (from the past 48 hour(s)).  Blood Alcohol level:  Lab Results  Component Value Date   ETH <10 09/05/2017    Metabolic Disorder Labs: No results found for: HGBA1C, MPG No results found for: PROLACTIN No results found for: CHOL, TRIG, HDL, CHOLHDL, VLDL, LDLCALC  Physical Findings: AIMS: Facial and Oral Movements Muscles of Facial Expression: None, normal Lips and Perioral Area: None, normal Jaw: None, normal Tongue: None, normal,Extremity Movements Upper (arms, wrists, hands, fingers): None, normal Lower (legs, knees, ankles, toes): None, normal, Trunk Movements Neck, shoulders, hips: None, normal, Overall Severity Severity of abnormal movements (highest score from questions above): None, normal Incapacitation due to abnormal movements: None, normal Patient's awareness of abnormal movements (rate only patient's report): No Awareness, Dental Status Current problems with teeth and/or dentures?: No Does patient usually wear dentures?: No  CIWA:  CIWA-Ar Total: 0 COWS:     Musculoskeletal: Strength & Muscle Tone: within normal limits Gait & Station: normal Patient leans: N/A  Psychiatric Specialty Exam: Physical Exam  Nursing note and vitals reviewed. Constitutional: He is oriented to person, place, and time. He appears well-developed and well-nourished.  HENT:  Head: Normocephalic and atraumatic.  Respiratory: Effort normal.  Neurological: He is alert and oriented to person, place, and time.    ROS  Blood pressure 124/76, pulse 78, temperature 99.2 F (37.3 C), temperature source Oral, resp. rate 18, height 6' (1.829 m), weight 83.9 kg (185 lb), SpO2 96 %.Body mass index is 25.09 kg/m.  General Appearance: Casual  Eye Contact:  Fair  Speech:  Normal  Rate  Volume:  Normal  Mood:  Anxious and Depressed  Affect:  Congruent  Thought Process:  Coherent  Orientation:  Full (Time, Place, and Person)  Thought Content:  Logical  Suicidal Thoughts:  No  Homicidal Thoughts:  No  Memory:  Immediate;   Fair Recent;   Fair Remote;   Fair  Judgement:  Intact  Insight:  Fair  Psychomotor Activity:  Normal  Concentration:  Concentration: Fair and Attention Span: Fair  Recall:  [employer] of Knowledge:  Fair  Language:  Fair  Akathisia:  Negative  Handed:  Right  AIMS (if indicated):  Assets:  Communication Skills Desire for Improvement Physical Health Resilience  ADL's:  Intact  Cognition:  WNL  Sleep:  Number of Hours: 6.75     Treatment Plan Summary: Daily contact with patient to assess and evaluate symptoms and progress in treatment, Medication management and Plan Patient is seen and examined.  Patient's 47 year old male with the above-stated past psychiatric history seen in follow-up.  He is slowly improving with the fluoxetine.  No change in that today.  He is having a hangover with the trazodone, and that will be decreased to 50 mg p.o. nightly.  His blood pressures been running a bit high, and we will adjust that medicine during the course the hospitalization.  No other changes in his medications.  Antonieta PertGreg Lawson Zakiya Sporrer, MD 09/11/2017, 2:53 PM

## 2017-09-11 NOTE — Progress Notes (Signed)
D:  James Fisher has been up and visible on the unit.  He attended evening wrap up group.  He interacts well with staff and peers.  He denied SI/HI or A/V hallucinations.  He reported his day started out bad but got better in the afternoon.  He denied any pain or discomfort and appeared to be in no physical distress. He requested vistaril for anxiety and trazodone at hs for sleep.   A:  1:1 with RN for support and encouragement.  Medications as ordered.  Q 15 minute checks maintained for safety.  Encouraged participation in group and unit activities.   R:  James Fisher remains safe on the unit.  We will continue to monitor the progress towards his goals.

## 2017-09-11 NOTE — Plan of Care (Signed)
  Problem: Activity: Goal: Sleeping patterns will improve Outcome: Progressing   Problem: Coping: Goal: Ability to verbalize frustrations and anger appropriately will improve Outcome: Progressing  James Fisher reported that he slept pretty good last night and requested the same medications.  He did report having issues with anger control and his hoping to have some medication that will help.  Encouraged him to talk with the doctor about the above.  We will continue to monitor the progress towards his goals.

## 2017-09-11 NOTE — Progress Notes (Signed)
Patient ID: James Fisher, male   DOB: 08/07/1970, 47 y.o.   MRN: [id number]  Nursing Progress Note [id number]  Data: Patient presents calm, pleasant and cooperative. Patient appears to be less irritable today. Patient complaint with scheduled medications. Patient complains of increasing pain in his R foot that he states "may be a gout flare up". Patient completed self-inventory sheet and rates depression, hopelessness, and anxiety 5,8,8 respectively. Patient rates their sleep and appetite as good/fair respectively. Patient states goal for today is to "focus on my sister and talk to her". Patient is seen attending groups and visible in the milieu. Patient currently denies SI/HI/AVH.   Action: Patient educated about and provided medication per provider's orders. Patient safety maintained with q15 min safety checks and frequent rounding. Low fall risk precautions in place. Emotional support given. 1:1 interaction and active listening provided. Patient encouraged to attend meals and groups. Patient encouraged to work on treatment plan and goals. Labs, vital signs and patient behavior monitored throughout shift.   Response: Patient agrees to come to staff if any thoughts of SI/HI develop or if patient develops intention of acting on thoughts. Patient remains safe on the unit at this time. Patient is interacting with peers appropriately on the unit. Will continue to support and monitor.

## 2017-09-12 LAB — BASIC METABOLIC PANEL
ANION GAP: 8 (ref 5–15)
BUN: 16 mg/dL (ref 6–20)
CO2: 29 mmol/L (ref 22–32)
Calcium: 9.3 mg/dL (ref 8.9–10.3)
Chloride: 105 mmol/L (ref 98–111)
Creatinine, Ser: 1.11 mg/dL (ref 0.61–1.24)
GFR calc Af Amer: >60 mL/min (ref >60)
GLUCOSE: 110 mg/dL — AB (ref 70–99)
POTASSIUM: 4 mmol/L (ref 3.5–5.1)
Sodium: 142 mmol/L (ref 135–145)

## 2017-09-12 LAB — HEPATIC FUNCTION PANEL
ALBUMIN: 4 g/dL (ref 3.5–5.0)
ALK PHOS: 80 U/L (ref 38–126)
ALT: 70 U/L — ABNORMAL HIGH (ref 0–44)
AST: 40 U/L (ref 15–41)
Bilirubin, Direct: 0.1 mg/dL (ref 0.0–0.2)
Indirect Bilirubin: 1 mg/dL — ABNORMAL HIGH (ref 0.3–0.9)
TOTAL PROTEIN: 6.6 g/dL (ref 6.5–8.1)
Total Bilirubin: 1.1 mg/dL (ref 0.3–1.2)

## 2017-09-12 LAB — SEDIMENTATION RATE: Sed Rate: 8 mm/hr (ref 0–16)

## 2017-09-12 LAB — URIC ACID: URIC ACID, SERUM: 7.4 mg/dL (ref 3.7–8.6)

## 2017-09-12 MED ORDER — HYDROXYZINE HCL 50 MG PO TABS
50.0000 mg | ORAL_TABLET | Freq: Every day | ORAL | Status: DC
  Administered 2017-09-13 – 2017-09-14 (×2): 50 mg via ORAL
  Filled 2017-09-12: qty 7
  Filled 2017-09-12 (×4): qty 1
  Filled 2017-09-12: qty 7

## 2017-09-12 MED ORDER — TRAZODONE HCL 50 MG PO TABS: 50.0000 mg | ORAL_TABLET | Freq: Every evening | ORAL | Status: DC | PRN

## 2017-09-12 MED ORDER — INDOMETHACIN 25 MG PO CAPS
25.0000 mg | ORAL_CAPSULE | Freq: Three times a day (TID) | ORAL | Status: DC | PRN
  Administered 2017-09-12: 25 mg via ORAL
  Filled 2017-09-12: qty 1

## 2017-09-12 MED ORDER — ACETAMINOPHEN 325 MG PO TABS
650.0000 mg | ORAL_TABLET | Freq: Four times a day (QID) | ORAL | Status: DC | PRN
  Administered 2017-09-12: 650 mg via ORAL
  Filled 2017-09-12: qty 2

## 2017-09-12 MED ORDER — BUSPIRONE HCL 5 MG PO TABS
5.0000 mg | ORAL_TABLET | Freq: Two times a day (BID) | ORAL | Status: DC
  Administered 2017-09-12 – 2017-09-14 (×3): 5 mg via ORAL
  Filled 2017-09-12 (×8): qty 1

## 2017-09-12 MED ORDER — FLUOXETINE HCL 20 MG PO CAPS
30.0000 mg | ORAL_CAPSULE | Freq: Every day | ORAL | Status: DC
  Administered 2017-09-14: 30 mg via ORAL
  Filled 2017-09-12 (×5): qty 1

## 2017-09-12 NOTE — Plan of Care (Signed)
  Problem: Coping: Goal: Ability to verbalize frustrations and anger appropriately will improve Outcome: Progressing   Problem: Self-Concept: Goal: Will verbalize positive feelings about self Outcome: Progressing   James Fisher reported a better day today.  He was happy that some of his peers had better days because they spoke with him.  "That made me feel good."  We will continue to monitor the progress towards his goals.

## 2017-09-12 NOTE — Progress Notes (Signed)
Recreation Therapy Notes  Date: 7.19.19 Time: 0930 Location: 300 [address] Dayroom  Group Topic: Stress Management  Goal Area(s) Addresses:  Patient will verbalize importance of using healthy stress management.  Patient will identify positive emotions associated with healthy stress management.   Intervention: Stress Management  Activity :  Mountain Meditation.  LRT introduced the stress management technique of meditation.  LRT played a played a meditation that allowed patients to take on the characteristics of a mountain.  Patients were to listen and follow along as the meditation.  Education:  Stress Management, Discharge Planning.   Education Outcome: Acknowledges edcuation/In group clarification offered/Needs additional education  Clinical Observations/Feedback: Pt did not attend group.      Kekai Geter, LRT/CTRS         Sae Handrich A 09/12/2017 11:34 AM 

## 2017-09-12 NOTE — Progress Notes (Signed)
D:  James Fisher was up and visible on the unit.  He was noted being in the day room interacting with staff and peers.  He was cheerful and joking during the interaction.  He denied SI/HI or A/V hallucinations.  He did state that during wrap up group, "4 people said there day was better after talking to me.  That made me feel good."  Gently encouraged him to make sure that he focuses oh himself during this stay and not try to work on everyone else's problem.  He did not complain of any pain during evening but when he went to lay down to sleep the right toe pain had gotten worse.  "It's worse when I lay down.  I took indomethacin when I had my last flare up."   Assessed both great toes and both were equal in size with slight more redness on the right toe.  Pain 5/10.  Notified Nira ConnJason Berry NP and new orders for ibuprofen obtained.  He was given dose along with Protonix to prevent stomach upset.  After checking back, he is resting with his eyes closed and appears to be in no physical distress.   A:  1:1 with RN for support and encouragement.  Medications as ordered.  Q 15 minute checks maintained for safety.  Encouraged participation in group and unit activities.   R:  James Fisher remains safe on the unit.  We will continue to monitor the progress towards his goals.

## 2017-09-12 NOTE — Tx Team (Signed)
Interdisciplinary Treatment and Diagnostic Plan Update  09/12/2017 Time of Session: 0955 James Fisher MRN: [id number]  Principal Diagnosis: <principal problem not specified>  Secondary Diagnoses: Active Problems:   MDD (major depressive disorder), severe (HCC)   Current Medications:  Current Facility-Administered Medications  Medication Dose Route Frequency Provider Last Rate Last Dose  . FLUoxetine (PROZAC) capsule 20 mg  20 mg Oral Daily Cobos, Rockey SituFernando A, MD   20 mg at 09/12/17 0820  . hydrOXYzine (ATARAX/VISTARIL) tablet 25 mg  25 mg Oral Q6H PRN Nira ConnBerry, Jason A, NP   25 mg at 09/11/17 2121  . hydrOXYzine (ATARAX/VISTARIL) tablet 50 mg  50 mg Oral QHS Antonieta Pertlary, Greg Lawson, MD      . indomethacin (INDOCIN) capsule 25 mg  25 mg Oral TID PRN Antonieta Pertlary, Greg Lawson, MD      . lisinopril (PRINIVIL,ZESTRIL) tablet 5 mg  5 mg Oral Daily Cobos, Rockey SituFernando A, MD   5 mg at 09/12/17 0820  . multivitamin with minerals tablet 1 tablet  1 tablet Oral Daily Cobos, Rockey SituFernando A, MD   1 tablet at 09/12/17 0820  . pantoprazole (PROTONIX) EC tablet 40 mg  40 mg Oral Daily Nira ConnBerry, Jason A, NP   40 mg at 09/11/17 2349  . thiamine (VITAMIN B-1) tablet 100 mg  100 mg Oral Daily Cobos, Rockey SituFernando A, MD   100 mg at 09/12/17 0820  . traZODone (DESYREL) tablet 50 mg  50 mg Oral QHS PRN Antonieta Pertlary, Greg Lawson, MD       PTA Medications: No medications prior to admission.    Patient Stressors:    Patient Strengths:    Treatment Modalities: Medication Management, Group therapy, Case management,  1 to 1 session with clinician, Psychoeducation, Recreational therapy.   Physician Treatment Plan for Primary Diagnosis: <principal problem not specified> Long Term Goal(s): Improvement in symptoms so as ready for discharge Improvement in symptoms so as ready for discharge   Short Term Goals: Ability to identify changes in lifestyle to reduce recurrence of condition will improve Ability to maintain clinical measurements within  normal limits will improve Ability to identify changes in lifestyle to reduce recurrence of condition will improve Ability to verbalize feelings will improve Ability to disclose and discuss suicidal ideas Ability to demonstrate self-control will improve Ability to identify and develop effective coping behaviors will improve Ability to maintain clinical measurements within normal limits will improve  Medication Management: Evaluate patient's response, side effects, and tolerance of medication regimen.  Therapeutic Interventions: 1 to 1 sessions, Unit Group sessions and Medication administration.  Evaluation of Outcomes: Progressing  Physician Treatment Plan for Secondary Diagnosis: Active Problems:   MDD (major depressive disorder), severe (HCC)  Long Term Goal(s): Improvement in symptoms so as ready for discharge Improvement in symptoms so as ready for discharge   Short Term Goals: Ability to identify changes in lifestyle to reduce recurrence of condition will improve Ability to maintain clinical measurements within normal limits will improve Ability to identify changes in lifestyle to reduce recurrence of condition will improve Ability to verbalize feelings will improve Ability to disclose and discuss suicidal ideas Ability to demonstrate self-control will improve Ability to identify and develop effective coping behaviors will improve Ability to maintain clinical measurements within normal limits will improve     Medication Management: Evaluate patient's response, side effects, and tolerance of medication regimen.  Therapeutic Interventions: 1 to 1 sessions, Unit Group sessions and Medication administration.  Evaluation of Outcomes: Progressing   RN Treatment Plan for Primary Diagnosis: <  principal problem not specified> Long Term Goal(s): Knowledge of disease and therapeutic regimen to maintain health will improve  Short Term Goals: Ability to identify and develop effective  coping behaviors will improve and Compliance with prescribed medications will improve  Medication Management: RN will administer medications as ordered by provider, will assess and evaluate patient's response and provide education to patient for prescribed medication. RN will report any adverse and/or side effects to prescribing provider.  Therapeutic Interventions: 1 on 1 counseling sessions, Psychoeducation, Medication administration, Evaluate responses to treatment, Monitor vital signs and CBGs as ordered, Perform/monitor CIWA, COWS, AIMS and Fall Risk screenings as ordered, Perform wound care treatments as ordered.  Evaluation of Outcomes: Progressing   LCSW Treatment Plan for Primary Diagnosis: <principal problem not specified> Long Term Goal(s): Safe transition to appropriate next level of care at discharge, Engage patient in therapeutic group addressing interpersonal concerns.  Short Term Goals: Engage patient in aftercare planning with referrals and resources, Increase social support and Increase skills for wellness and recovery  Therapeutic Interventions: Assess for all discharge needs, 1 to 1 time with Social worker, Explore available resources and support systems, Assess for adequacy in community support network, Educate family and significant other(s) on suicide prevention, Complete Psychosocial Assessment, Interpersonal group therapy.  Evaluation of Outcomes: Progressing   Progress in Treatment: Attending groups: Yes Participating in groups: Yes Taking medication as prescribed: Yes. Toleration medication: Yes. Family/Significant other contact made: No, will contact:  pt declined consent Patient understands diagnosis: Yes. Discussing patient identified problems/goals with staff: Yes. Medical problems stabilized or resolved: Yes. Denies suicidal/homicidal ideation: Yes. Issues/concerns per patient self-inventory: No. Other: none  New problem(s) identified: No, Describe:   none  New Short Term/Long Term Goal(s):  Patient Goals:  "To try to figure out why things have happened in my life."  Discharge Plan or Barriers:   Reason for Continuation of Hospitalization: Depression Medication stabilization  Estimated Length of Stay: 3-5 days.  Attendees: Patient: 09/12/2017   Physician: Dr. Jola Babinski, MD 09/12/2017   Nursing: Norma Fredrickson, RN 09/12/2017   RN Care Manager: 09/12/2017   Social Worker: Daleen Squibb, LCSW 09/12/2017   Recreational Therapist:  09/12/2017   Other:  09/12/2017   Other:  09/12/2017   Other: 09/12/2017        Scribe for Treatment Team: Lorri Frederick, LCSW 09/12/2017 1:01 PM

## 2017-09-12 NOTE — [facility] Suicide Risk Assessment (Signed)
[facility] INPATIENT:  Family/Significant Other Suicide Prevention Education  Suicide Prevention Education:  Education Completed; James Fisher, sister, [phone], has been identified by the patient as the family member/significant other with whom the patient will be residing, and identified as the person(s) who will aid the patient in the event of a mental health crisis (suicidal ideations/suicide attempt).  With written consent from the patient, the family member/significant other has been provided the following suicide prevention education, prior to the and/or following the discharge of the patient.  The suicide prevention education provided includes the following:  Suicide risk factors  Suicide prevention and interventions  National Suicide Hotline telephone number  [facility] assessment telephone number  [facility] Assistance 911  [facility] and/or [facility]idential Mobile Crisis [facility] telephone number  Request made of family/significant other to:  Remove weapons (e.g., guns, rifles, knives), all items previously/currently identified as safety concern.    Remove drugs/medications (over-the-counter, prescriptions, illicit drugs), all items previously/currently identified as a safety concern.  The family member/significant other verbalizes understanding of the suicide prevention education information provided.  The family member/significant other agrees to remove the items of safety concern listed above.  James Fisher, James Fisher Jon, LCSW 09/12/2017, 3:33 PM

## 2017-09-12 NOTE — Plan of Care (Signed)
Pt progressing in the following metrics  D: pt found in the dayroom interacting with others. Pt pleasant and compliant with medication pass. Pt states he slept well last night. Pt rates his pain a 6/10 in his R great toe. Pt complaining that it may be gout related. Pt rates his depression/hopelessness/anxiety a 4/7/7 out of 10 respectively. Pt states her goal for today is to call his sister and will achieve this by picking up the phone. Pt denies any si/hi/ah/vh and verbally agrees to approach staff if these become  apparent.  A: pt provided support and encouragement. Pt provided medications per protocol and standing orders. [id number] safety checks implemented and continued.  R: pt safe on the unit. Will continue to monitor.   Problem: Education: Goal: Emotional status will improve Outcome: Progressing Goal: Mental status will improve Outcome: Progressing   Problem: Activity: Goal: Interest or engagement in activities will improve Outcome: Progressing   Problem: Coping: Goal: Ability to demonstrate self-control will improve Outcome: Progressing   Problem: Safety: Goal: Periods of time without injury will increase Outcome: Progressing   Problem: Education: Goal: Knowledge of the prescribed therapeutic regimen will improve Outcome: Progressing   Problem: Activity: Goal: Interest or engagement in leisure activities will improve Outcome: Progressing Goal: Imbalance in normal sleep/wake cycle will improve Outcome: Progressing   Problem: Coping: Goal: Will verbalize feelings Outcome: Progressing   Problem: Safety: Goal: Ability to disclose and discuss suicidal ideas will improve Outcome: Progressing   Problem: Self-Concept: Goal: Level of anxiety will decrease Outcome: Progressing   Problem: Self-Concept: Goal: Ability to disclose and discuss suicidal ideas will improve Outcome: Progressing Goal: Will verbalize positive feelings about self Outcome: Progressing

## 2017-09-12 NOTE — [facility] Group Notes (Signed)
  [facility]  Date/Time: 09/12/17, 1315  Type of Therapy/Topic:  Group Therapy:  Emotion Regulation  Participation Level:  Active   Mood:pleasant  Description of Group:    The purpose of this group is to assist patients in learning to regulate negative emotions and experience positive emotions. Patients will be guided to discuss ways in which they have been vulnerable to their negative emotions. These vulnerabilities will be juxtaposed with experiences of positive emotions or situations, and patients challenged to use positive emotions to combat negative ones. Special emphasis will be placed on coping with negative emotions in conflict situations, and patients will process healthy conflict resolution skills.  Therapeutic Goals: 1. Patient will identify two positive emotions or experiences to reflect on in order to balance out negative emotions:  2. Patient will label two or more emotions that they find the most difficult to experience:  3. Patient will be able to demonstrate positive conflict resolution skills through discussion or role plays:   Summary of Patient Progress:Pt shared that anger is an emotion that is difficult for him to experience.  Pt active in group discussion regarding positive ways to handle difficult emotions.        Therapeutic Modalities:   Cognitive Behavioral Therapy Feelings Identification Dialectical Behavioral Therapy  Daleen SquibbGreg Willard Farquharson, LCSW

## 2017-09-12 NOTE — Progress Notes (Signed)
[facility] MD Progress Note  09/12/2017 1:38 PM James Fisher  MRN:  [id number] Subjective: Patient is seen and examined.  Patient is a 47 year old male with past psychiatric history significant for depression, anxiety as well as alcohol use disorder.  He is seen in follow-up.  He is a little bit more anxious this morning.  He tried to call his sister last night, but she did not answer.  He stated that his father is visiting his sister, and his father and he did not get along.  He thinks 1 of the reasons why she asked him to leave was because of the father as well as the boyfriend.  We discussed calling her again.  He realizes that from the regard of housing that that would be his best option.  He also has decided he wants to say he was sorry for some of his behaviors.  He was complaining of gouty-like pain, and his laboratories this morning were negative.  His uric acid is between 7.4 and his sedimentation rate was only 9.  He stated that his foot was hurting, and that he had taken indomethacin previously.  His creatinine is 1.11, and we discussed starting the indomethacin.  He denied any suicidal ideation, and still feels as though the medications are helping. Principal Problem: <principal problem not specified> Diagnosis:   Patient Active Problem List   Diagnosis Date Noted  . MDD (major depressive disorder), severe ([place]) [F32.2] 09/06/2017  . ANXIETY DISORDER [F41.1] 01/17/2009  . ALCOHOLISM [F10.20] 01/17/2009  . ESSENTIAL HYPERTENSION, BENIGN [I10] 01/17/2009   Total Time spent with patient: 20 minutes  Past Psychiatric History: See admission H&P  Past Medical History:  Past Medical History:  Diagnosis Date  . Gout   . Hypertension    History reviewed. No pertinent surgical history. Family History: History reviewed. No pertinent family history. Family Psychiatric  History: See admission H&P Social History:  Social History   Substance and Sexual Activity  Alcohol Use Yes   Comment: 50-60  beers a week     Social History   Substance and Sexual Activity  Drug Use Not Currently    Social History   Socioeconomic History  . Marital status: Single    Spouse name: Not on file  . Number of children: Not on file  . Years of education: Not on file  . Highest education level: Not on file  Occupational History  . Not on file  Social Needs  . Financial resource strain: Not on file  . Food insecurity:    Worry: Not on file    Inability: Not on file  . Transportation needs:    Medical: Not on file    Non-medical: Not on file  Tobacco Use  . Smoking status: Never Smoker  . Smokeless tobacco: Never Used  Substance and Sexual Activity  . Alcohol use: Yes    Comment: 50-60 beers a week  . Drug use: Not Currently  . Sexual activity: Not on file  Lifestyle  . Physical activity:    Days per week: Not on file    Minutes per session: Not on file  . Stress: Not on file  Relationships  . Social connections:    Talks on phone: Not on file    Gets together: Not on file    Attends religious service: Not on file    Active member of club or organization: Not on file    Attends meetings of clubs or organizations: Not on file  Relationship status: Not on file  Other Topics Concern  . Not on file  Social History Narrative  . Not on file   Additional Social History:    History of alcohol / drug use?: No history of alcohol / drug abuse                    Sleep: Fair  Appetite:  Fair  Current Medications: Current Facility-Administered Medications  Medication Dose Route Frequency Provider Last Rate Last Dose  . FLUoxetine (PROZAC) capsule 20 mg  20 mg Oral Daily Cobos, Myer Peer, MD   20 mg at 09/12/17 0820  . hydrOXYzine (ATARAX/VISTARIL) tablet 25 mg  25 mg Oral Q6H PRN Lindon Romp A, NP   25 mg at 09/11/17 2121  . hydrOXYzine (ATARAX/VISTARIL) tablet 50 mg  50 mg Oral QHS Sharma Covert, MD      . indomethacin (INDOCIN) capsule 25 mg  25 mg Oral TID PRN  Sharma Covert, MD      . lisinopril (PRINIVIL,ZESTRIL) tablet 5 mg  5 mg Oral Daily Cobos, Myer Peer, MD   5 mg at 09/12/17 0820  . multivitamin with minerals tablet 1 tablet  1 tablet Oral Daily Cobos, Myer Peer, MD   1 tablet at 09/12/17 0820  . pantoprazole (PROTONIX) EC tablet 40 mg  40 mg Oral Daily Lindon Romp A, NP   40 mg at 09/11/17 2349  . thiamine (VITAMIN B-1) tablet 100 mg  100 mg Oral Daily Cobos, Myer Peer, MD   100 mg at 09/12/17 0820  . traZODone (DESYREL) tablet 50 mg  50 mg Oral QHS PRN Sharma Covert, MD        Lab Results:  Results for orders placed or performed during the hospital encounter of 09/06/17 (from the past 48 hour(s))  Hepatic function panel     Status: Abnormal   Collection Time: 09/12/17  6:46 AM  Result Value Ref Range   Total Protein 6.6 6.5 - 8.1 g/dL   Albumin 4.0 3.5 - 5.0 g/dL   AST 40 15 - 41 U/L   ALT 70 (H) 0 - 44 U/L    Comment: Please note change in reference range.   Alkaline Phosphatase 80 38 - 126 U/L   Total Bilirubin 1.1 0.3 - 1.2 mg/dL   Bilirubin, Direct 0.1 0.0 - 0.2 mg/dL    Comment: Please note change in reference range.   Indirect Bilirubin 1.0 (H) 0.3 - 0.9 mg/dL    Comment: Performed at [facility], [place] [address] [place], [place] [address]  Uric acid     Status: None   Collection Time: 09/12/17  6:46 AM  Result Value Ref Range   Uric Acid, Serum 7.4 3.7 - 8.6 mg/dL    Comment: Please note change in reference range. Performed at [facility], [place] [address], [place], [place] [address]   Basic metabolic panel     Status: Abnormal   Collection Time: 09/12/17  6:46 AM  Result Value Ref Range   Sodium 142 135 - 145 mmol/L   Potassium 4.0 3.5 - 5.1 mmol/L   Chloride 105 98 - 111 mmol/L    Comment: Please note change in reference range.   CO2 29 22 - 32 mmol/L   Glucose, Bld 110 (H) 70 - 99 mg/dL    Comment: Please note change in reference range.   BUN 16 6 - 20 mg/dL     Comment: Please note  change in reference range.   Creatinine, Ser 1.11 0.61 - 1.24 mg/dL   Calcium 9.3 8.9 - 10.3 mg/dL   GFR calc non Af Amer >60 >60 mL/min   GFR calc Af Amer >60 >60 mL/min    Comment: (NOTE) The eGFR has been calculated using the CKD EPI equation. This calculation has not been validated in all clinical situations. eGFR's persistently <60 mL/min signify possible Chronic Kidney Disease.    Anion gap 8 5 - 15    Comment: Performed at [facility], [place] [address] [place], [place] [address]  Sedimentation rate     Status: None   Collection Time: 09/12/17  6:46 AM  Result Value Ref Range   Sed Rate 8 0 - 16 mm/hr    Comment: Performed at [facility], [place] [address], [place], [place] [address]    Blood Alcohol level:  Lab Results  Component Value Date   ETH <10 02/40/9735    Metabolic Disorder Labs: No results found for: HGBA1C, MPG No results found for: PROLACTIN No results found for: CHOL, TRIG, HDL, CHOLHDL, VLDL, LDLCALC  Physical Findings: AIMS: Facial and Oral Movements Muscles of Facial Expression: None, normal Lips and Perioral Area: None, normal Jaw: None, normal Tongue: None, normal,Extremity Movements Upper (arms, wrists, hands, fingers): None, normal Lower (legs, knees, ankles, toes): None, normal, Trunk Movements Neck, shoulders, hips: None, normal, Overall Severity Severity of abnormal movements (highest score from questions above): None, normal Incapacitation due to abnormal movements: None, normal Patient's awareness of abnormal movements (rate only patient's report): No Awareness, Dental Status Current problems with teeth and/or dentures?: No Does patient usually wear dentures?: No  CIWA:  CIWA-Ar Total: 0 COWS:     Musculoskeletal: Strength & Muscle Tone: within normal limits Gait & Station: normal Patient leans: N/A  Psychiatric Specialty Exam: Physical Exam  Nursing note and vitals  reviewed. Constitutional: He is oriented to person, place, and time. He appears well-developed and well-nourished.  HENT:  Head: Normocephalic and atraumatic.  Respiratory: Effort normal.  Neurological: He is alert and oriented to person, place, and time.    ROS  Blood pressure 109/74, pulse 79, temperature 97.8 F (36.6 C), temperature source Oral, resp. rate 18, height 6' (1.829 m), weight 83.9 kg (185 lb), SpO2 96 %.Body mass index is 25.09 kg/m.  General Appearance: Casual  Eye Contact:  Fair  Speech:  Normal Rate  Volume:  Normal  Mood:  Anxious  Affect:  Congruent  Thought Process:  Coherent  Orientation:  Full (Time, Place, and Person)  Thought Content:  Logical  Suicidal Thoughts:  No  Homicidal Thoughts:  No  Memory:  Immediate;   Fair Recent;   Fair Remote;   Fair  Judgement:  Intact  Insight:  Fair  Psychomotor Activity:  Increased  Concentration:  Concentration: Fair and Attention Span: Fair  Recall:  [employer] of Knowledge:  Fair  Language:  Fair  Akathisia:  Negative  Handed:  Right  AIMS (if indicated):     Assets:  Communication Skills Desire for Improvement Physical Health Resilience Talents/Skills  ADL's:  Intact  Cognition:  WNL  Sleep:  Number of Hours: 5.25     Treatment Plan Summary: Daily contact with patient to assess and evaluate symptoms and progress in treatment, Medication management and Plan Patient is seen and examined.  Patient's 47 year old male with the above-stated past psychiatric history was seen in follow-up.  He is more anxious today about his conversation  with his sister.  I am going to add BuSpar 5 mg p.o. twice daily for this.  I am also going to increase his fluoxetine to 30 mg p.o. daily.  He has complained of sleep issues and he believes the timing of his medications are part of the problem.  I have specifically asked for the trazodone to be given at 9 PM, and a bedtime dose of hydroxyzine to be given at 8 PM.  Hopefully this  will be beneficial.  I have also written for indomethacin twice daily for his pain.  Sharma Covert, MD 09/12/2017, 1:38 PM

## 2017-09-13 ENCOUNTER — Inpatient Hospital Stay (HOSPITAL_COMMUNITY)
Admission: AD | Admit: 2017-09-13 | Discharge: 2017-09-13 | Disposition: A | Discharge status: Home / Self Care | Payer: Federal, State, Local not specified - Other | Source: Intra-hospital | Attending: Psychiatry | Admitting: Psychiatry

## 2017-09-13 ENCOUNTER — Inpatient Hospital Stay (HOSPITAL_COMMUNITY): Payer: Federal, State, Local not specified - Other

## 2017-09-13 MED ORDER — TRAZODONE HCL 100 MG PO TABS
100.0000 mg | ORAL_TABLET | Freq: Every evening | ORAL | Status: DC | PRN
  Administered 2017-09-13 – 2017-09-14 (×2): 100 mg via ORAL
  Filled 2017-09-13: qty 1
  Filled 2017-09-13: qty 7
  Filled 2017-09-13: qty 1

## 2017-09-13 MED ORDER — INDOMETHACIN 25 MG PO CAPS
50.0000 mg | ORAL_CAPSULE | Freq: Three times a day (TID) | ORAL | Status: DC | PRN
  Administered 2017-09-13 – 2017-09-14 (×4): 50 mg via ORAL
  Filled 2017-09-13 (×4): qty 2

## 2017-09-13 NOTE — Progress Notes (Signed)
[facility] MD Progress Note  09/13/2017 11:02 AM James Fisher  MRN:  [id number]   Subjective:  James Fisher seen attending group session.  Patient was evaluated by NP and attending psychiatrist presents irritable and  agitated regarding gout pain.  Initially James Fisher requested to be discharge as he was denying suicidal and or  homicidal ideations.  James Fisher then expressed concerns about seeing his father after discharge. Reports guilt about his failed suicidal attempt. Patient continues to have passive suicidal ideations.  Reports his sister came for visitation on  last night however states he did not "go to well."  Patient reports feeling remorse and sadness that he caused his sister pain and regarding his failed suicide attempt. James Fisher appears to have limited insight, patient's  mood is labile and irritable.  Patient voices frustration and concerns regarding medication administration times and new roommates been admitted late at night.  Denies auditory or visual hallucinations.  Reports a fair appetite.  States she is resting well.  Per attending physician patient to continue inpatient hospitalization.  Patient was agreeable to treatment plan.  Trazodone increased for insomnia.  Support encouragement reassurance was provided.  History: Per HPI-47 year old male, reports he was contemplating suicide by driving his motorcycle into an overpass. States " I was actually driving around that intersection thinking about it for two hours, but could not do it". He decided to call 911 for help and was brought to the ED. He states he has been facing significant stressors, quit his job several weeks ago, and sister ( with whom he lives) became involved with someone, so that he needs to leave .States " I guess I just decided to ride my motorcycle for a few days, try to enjoy life a little , and then kill myself ". Endorses some neuro-vegetative symptoms, but states that beyond depression, his decision to commit suicide was based on his  current financial /housing stressors and not seeing other alternative. States " I feel like I have no options, I don't have any money, can't go back".    Principal Problem: <principal problem not specified> Diagnosis:   Patient Active Problem List   Diagnosis Date Noted  . MDD (major depressive disorder), severe ([place]) [F32.2] 09/06/2017  . ANXIETY DISORDER [F41.1] 01/17/2009  . ALCOHOLISM [F10.20] 01/17/2009  . ESSENTIAL HYPERTENSION, BENIGN [I10] 01/17/2009   Total Time spent with patient: 20 minutes  Past Psychiatric History: See admission H&P  Past Medical History:  Past Medical History:  Diagnosis Date  . Gout   . Hypertension    History reviewed. No pertinent surgical history. Family History: History reviewed. No pertinent family history. Family Psychiatric  History: See admission H&P Social History:  Social History   Substance and Sexual Activity  Alcohol Use Yes   Comment: 50-60 beers a week     Social History   Substance and Sexual Activity  Drug Use Not Currently    Social History   Socioeconomic History  . Marital status: Single    Spouse name: Not on file  . Number of children: Not on file  . Years of education: Not on file  . Highest education level: Not on file  Occupational History  . Not on file  Social Needs  . Financial resource strain: Not on file  . Food insecurity:    Worry: Not on file    Inability: Not on file  . Transportation needs:    Medical: Not on file    Non-medical: Not on file  Tobacco Use  .  Smoking status: Never Smoker  . Smokeless tobacco: Never Used  Substance and Sexual Activity  . Alcohol use: Yes    Comment: 50-60 beers a week  . Drug use: Not Currently  . Sexual activity: Not on file  Lifestyle  . Physical activity:    Days per week: Not on file    Minutes per session: Not on file  . Stress: Not on file  Relationships  . Social connections:    Talks on phone: Not on file    Gets together: Not on file     Attends religious service: Not on file    Active member of club or organization: Not on file    Attends meetings of clubs or organizations: Not on file    Relationship status: Not on file  Other Topics Concern  . Not on file  Social History Narrative  . Not on file   Additional Social History:    History of alcohol / drug use?: No history of alcohol / drug abuse                    Sleep: Fair  Appetite:  Fair  Current Medications: Current Facility-Administered Medications  Medication Dose Route Frequency Provider Last Rate Last Dose  . acetaminophen (TYLENOL) tablet 650 mg  650 mg Oral Q6H PRN Money, Lowry Ram, FNP   650 mg at 09/12/17 1823  . busPIRone (BUSPAR) tablet 5 mg  5 mg Oral BID Sharma Covert, MD   5 mg at 09/12/17 1645  . FLUoxetine (PROZAC) capsule 30 mg  30 mg Oral Daily Sharma Covert, MD      . hydrOXYzine (ATARAX/VISTARIL) tablet 25 mg  25 mg Oral Q6H PRN Lindon Romp A, NP   25 mg at 09/11/17 2121  . hydrOXYzine (ATARAX/VISTARIL) tablet 50 mg  50 mg Oral QHS Sharma Covert, MD      . indomethacin (INDOCIN) capsule 25 mg  25 mg Oral TID PRN Sharma Covert, MD   25 mg at 09/12/17 1357  . lisinopril (PRINIVIL,ZESTRIL) tablet 5 mg  5 mg Oral Daily Cobos, Myer Peer, MD   5 mg at 09/12/17 0820  . multivitamin with minerals tablet 1 tablet  1 tablet Oral Daily Cobos, Myer Peer, MD   1 tablet at 09/12/17 0820  . pantoprazole (PROTONIX) EC tablet 40 mg  40 mg Oral Daily Lindon Romp A, NP   40 mg at 09/11/17 2349  . thiamine (VITAMIN B-1) tablet 100 mg  100 mg Oral Daily Cobos, Myer Peer, MD   100 mg at 09/12/17 0820  . traZODone (DESYREL) tablet 50 mg  50 mg Oral QHS PRN Sharma Covert, MD        Lab Results:  Results for orders placed or performed during the hospital encounter of 09/06/17 (from the past 48 hour(s))  Hepatic function panel     Status: Abnormal   Collection Time: 09/12/17  6:46 AM  Result Value Ref Range   Total Protein  6.6 6.5 - 8.1 g/dL   Albumin 4.0 3.5 - 5.0 g/dL   AST 40 15 - 41 U/L   ALT 70 (H) 0 - 44 U/L    Comment: Please note change in reference range.   Alkaline Phosphatase 80 38 - 126 U/L   Total Bilirubin 1.1 0.3 - 1.2 mg/dL   Bilirubin, Direct 0.1 0.0 - 0.2 mg/dL    Comment: Please note change in reference range.   Indirect Bilirubin 1.0 (  H) 0.3 - 0.9 mg/dL    Comment: Performed at [facility], [place] [address] [place], [place] [address]  Uric acid     Status: None   Collection Time: 09/12/17  6:46 AM  Result Value Ref Range   Uric Acid, Serum 7.4 3.7 - 8.6 mg/dL    Comment: Please note change in reference range. Performed at [facility], [place] [address], [place], [place] [address]   Basic metabolic panel     Status: Abnormal   Collection Time: 09/12/17  6:46 AM  Result Value Ref Range   Sodium 142 135 - 145 mmol/L   Potassium 4.0 3.5 - 5.1 mmol/L   Chloride 105 98 - 111 mmol/L    Comment: Please note change in reference range.   CO2 29 22 - 32 mmol/L   Glucose, Bld 110 (H) 70 - 99 mg/dL    Comment: Please note change in reference range.   BUN 16 6 - 20 mg/dL    Comment: Please note change in reference range.   Creatinine, Ser 1.11 0.61 - 1.24 mg/dL   Calcium 9.3 8.9 - 10.3 mg/dL   GFR calc non Af Amer >60 >60 mL/min   GFR calc Af Amer >60 >60 mL/min    Comment: (NOTE) The eGFR has been calculated using the CKD EPI equation. This calculation has not been validated in all clinical situations. eGFR's persistently <60 mL/min signify possible Chronic Kidney Disease.    Anion gap 8 5 - 15    Comment: Performed at [facility], [place] [address], [place], [place] [address]  Sedimentation rate     Status: None   Collection Time: 09/12/17  6:46 AM  Result Value Ref Range   Sed Rate 8 0 - 16 mm/hr    Comment: Performed at [facility], [place] [address], [place], [place] [address]    Blood Alcohol level:  Lab  Results  Component Value Date   ETH <10 41/63/8453    Metabolic Disorder Labs: No results found for: HGBA1C, MPG No results found for: PROLACTIN No results found for: CHOL, TRIG, HDL, CHOLHDL, VLDL, LDLCALC  Physical Findings: AIMS: Facial and Oral Movements Muscles of Facial Expression: None, normal Lips and Perioral Area: None, normal Jaw: None, normal Tongue: None, normal,Extremity Movements Upper (arms, wrists, hands, fingers): None, normal Lower (legs, knees, ankles, toes): None, normal, Trunk Movements Neck, shoulders, hips: None, normal, Overall Severity Severity of abnormal movements (highest score from questions above): None, normal Incapacitation due to abnormal movements: None, normal Patient's awareness of abnormal movements (rate only patient's report): No Awareness, Dental Status Current problems with teeth and/or dentures?: No Does patient usually wear dentures?: No  CIWA:  CIWA-Ar Total: 2 COWS:  COWS Total Score: 2  Musculoskeletal: Strength & Muscle Tone: within normal limits Gait & Station: normal Patient leans: N/A  Psychiatric Specialty Exam: Physical Exam  Nursing note and vitals reviewed. Constitutional: He is oriented to person, place, and time. He appears well-developed and well-nourished.  HENT:  Head: Normocephalic and atraumatic.  Respiratory: Effort normal.  Neurological: He is alert and oriented to person, place, and time.    Review of Systems  Musculoskeletal: Positive for joint pain.  Psychiatric/Behavioral: Positive for depression and suicidal ideas. The patient is nervous/anxious.   All other systems reviewed and are negative.   Blood pressure (!) 143/96, pulse 76, temperature 97.8 F (36.6 C), temperature source Oral, resp. rate 18, height 6' (1.829 m), weight 83.9  kg (185 lb), SpO2 96 %.Body mass index is 25.09 kg/m.  General Appearance: Casual  Eye Contact:  Fair  Speech:  Normal Rate  Volume:  Normal  Mood:  Anxious  Affect:   Congruent  Thought Process:  Coherent  Orientation:  Full (Time, Place, and Person)  Thought Content:  Logical  Suicidal Thoughts:  No  Homicidal Thoughts:  No  Memory:  Immediate;   Fair Recent;   Fair Remote;   Fair  Judgement:  Intact  Insight:  Shallow  Psychomotor Activity:  Increased  Concentration:  Concentration: Fair and Attention Span: Fair  Recall:  [employer] of Knowledge:  Fair  Language:  Fair  Akathisia:  Negative  Handed:  Right  AIMS (if indicated):     Assets:  Communication Skills Desire for Improvement Physical Health Resilience Talents/Skills  ADL's:  Intact  Cognition:  WNL  Sleep:  Number of Hours: 6.75     Treatment Plan Summary: Daily contact with patient to assess and evaluate symptoms and progress in treatment and Medication management   Continue with current treatment plan on 09/13/2017 as listed below except were noted  Depression/Anxeity:   Continue with Buspar 5 mg BID   Continue Fluoxetine 30 mg    Increased  with Trazodone 50 mg to  100 mg for insomnia  Toe pain:  Continue in indomethacin 50 mg - Xray to be schedule   Will continue to monitor vitals ,medication compliance and treatment side effects while patient is here.   CSW will continue  working on disposition.  Patient to participate in therapeutic milieu  Derrill [place], NP 09/13/2017, 11:02 AM

## 2017-09-13 NOTE — Progress Notes (Signed)
D:  James Fisher was upset this evening after visitation.  He attended evening wrap up group and was very negative during.  After group, he reported that things aren't going the way he thought and he was angry.  Refusing to take medication and being evasive with staff.  He denied SI/HI or A/V hallucinations.  He was asked what changed since yesterday because he reported having a good day.  He responded "I don't have to tell you."  He continues to complain of right great toe pain but refused to answer pain level question.  He refused RN to assess his toe.  He was noted walking fast and angrily up the hall to his room.  He was angry because "I am going to get a roommate and they will come in at 3am and wake me up, so why do I need to take something to sleep if I am going to be woken up?"  No medications were given.  He is currently asleep in his room snoring.   A:  1:1 with RN for support and encouragement.  Medications offered as ordered.  Q 15 minute checks maintained for safety.  Encouraged participation in group and unit activities.   R:  James Fisher remains safe on the unit.  We will continue to monitor the progress towards his goals.

## 2017-09-13 NOTE — B[facility] Group Notes (Signed)
LCSW Group Therapy Note  09/13/2017    10:30-11:30am   Type of Therapy and Topic:  Group Therapy: Anger and Coping Skills  Participation Level:  Minimal   Description of Group:   In this group, patients learned how to recognize the physical, cognitive, emotional, and behavioral responses they have to anger-provoking situations.  They identified how they usually or often react when angered, and learned how healthy and unhealthy coping skills work initially, but the unhealthy ones stop working.   They analyzed how their frequently-chosen coping skill is possibly beneficial and how it is possibly unhelpful.  The group discussed a variety of healthier coping skills that could help in resolving the actual issues, as well as how to go about planning for the the possibility of future similar situations.  Therapeutic Goals: 1. Patients will identify one thing that makes them angry and how they feel emotionally and physically, what their thoughts are or tend to be in those situations, and what healthy or unhealthy coping mechanism they typically use 2. Patients will identify how their coping technique works for them, as well as how it works against them. 3. Patients will explore possible new behaviors to use in future anger situations. 4. Patients will learn that anger itself is normal and cannot be eliminated, and that healthier coping skills can assist with resolving conflict rather than worsening situations.  Summary of Patient Progress:  The patient shared only his name, then "passed" on sharing anything about himself.  Later in group he did want to challenge why he is still in the hospital and this led to a short description of the IVC versus voluntary process and CSW directed him to talk with his nurse as to whether he is eligible to sign a 72-hour discharge request.  Therapeutic Modalities:   Cognitive Behavioral Therapy Motivation Interviewing  Lynnell ChadMareida J Grossman-Orr  .

## 2017-09-13 NOTE — Plan of Care (Signed)
  Problem: Coping: Goal: Ability to verbalize frustrations and anger appropriately will improve Outcome: Not Progressing  James Fisher was angry this evening, instead of talking with staff for assistance, he chose to get angry and refuse medications.  Staff encouraged talking with staff and tried to get him to process and he stated "you are not my therapist."   We will continue to monitor the progress towards his goals.

## 2017-09-13 NOTE — Progress Notes (Signed)
Adult Psychoeducational Group Note  Date:  09/13/2017 Time:  9:29 PM  Group Topic/Focus:  Wrap-Up Group:   The focus of this group is to help patients review their daily goal of treatment and discuss progress on daily workbooks.  Participation Level:  Active  Participation Quality:  Appropriate  Affect:  Appropriate  Cognitive:  Appropriate  Insight: Appropriate  Engagement in Group:  Engaged  Modes of Intervention:  Discussion  Additional Comments: Patient attended and participated.   Montel Vanderhoof W Cathy Crounse 09/13/2017, 9:29 PM

## 2017-09-13 NOTE — Plan of Care (Addendum)
D: Patient presents agitated, hostile, non-compliant. When asked how his mood is, he stated "What do you think?" When asked why he felt that way, "Man, I got gout." He has a minor limp with right foot. Respirations even and unlabored. He refused his morning medications, but did agree to take the indomethacin this afternoon. [facility] to right foot to rule out causes of pain. His pain in right foot is 9/10. Patient denies SI/HI/AVH.  A: Patient checked q15 min, and checks reviewed. Reviewed medication changes with patient and educated on side effects. Educated patient on importance of attending group therapy sessions and educated on several coping skills. Encouarged participation in milieu through recreation therapy and attending meals with peers. Support and encouragement provided. Fluids offered. R:Patient refused his morning medication. Patient contracts for safety on the unit.

## 2017-09-14 MED ORDER — BUSPIRONE HCL 5 MG PO TABS: 5.0000 mg | ORAL_TABLET | Freq: Three times a day (TID) | ORAL | 0 refills | Status: DC

## 2017-09-14 MED ORDER — LISINOPRIL 5 MG PO TABS
5.0000 mg | ORAL_TABLET | Freq: Once | ORAL | Status: AC
  Administered 2017-09-14: 5 mg via ORAL
  Filled 2017-09-14: qty 1

## 2017-09-14 MED ORDER — LISINOPRIL 10 MG PO TABS: 10.0000 mg | ORAL_TABLET | Freq: Every day | ORAL | 0 refills | Status: DC

## 2017-09-14 MED ORDER — FLUOXETINE HCL 10 MG PO CAPS: 30.0000 mg | ORAL_CAPSULE | Freq: Every day | ORAL | 0 refills | Status: DC

## 2017-09-14 MED ORDER — LISINOPRIL 10 MG PO TABS
10.0000 mg | ORAL_TABLET | Freq: Every day | ORAL | Status: DC
  Filled 2017-09-14 (×2): qty 1
  Filled 2017-09-14 (×2): qty 7

## 2017-09-14 MED ORDER — HYDROXYZINE HCL 50 MG PO TABS: 50.0000 mg | ORAL_TABLET | Freq: Every day | ORAL | 0 refills | Status: DC

## 2017-09-14 MED ORDER — BUSPIRONE HCL 5 MG PO TABS
5.0000 mg | ORAL_TABLET | Freq: Three times a day (TID) | ORAL | Status: DC
  Administered 2017-09-14 (×2): 5 mg via ORAL
  Filled 2017-09-14: qty 21
  Filled 2017-09-14: qty 1
  Filled 2017-09-14: qty 21
  Filled 2017-09-14 (×2): qty 1
  Filled 2017-09-14: qty 21
  Filled 2017-09-14 (×2): qty 1
  Filled 2017-09-14: qty 21
  Filled 2017-09-14: qty 1
  Filled 2017-09-14 (×2): qty 21

## 2017-09-14 MED ORDER — TRAZODONE HCL 100 MG PO TABS: 100.0000 mg | ORAL_TABLET | Freq: Every evening | ORAL | 0 refills | Status: DC | PRN

## 2017-09-14 MED ORDER — SUCRALFATE 1 GM/10ML PO SUSP
1.0000 g | Freq: Once | ORAL | Status: AC
  Administered 2017-09-14: 1 g via ORAL
  Filled 2017-09-14 (×2): qty 10

## 2017-09-14 MED ORDER — HYDROXYZINE HCL 25 MG PO TABS: 25.0000 mg | ORAL_TABLET | Freq: Four times a day (QID) | ORAL | 0 refills | Status: DC | PRN

## 2017-09-14 MED ORDER — PANTOPRAZOLE SODIUM 40 MG PO TBEC: 40.0000 mg | DELAYED_RELEASE_TABLET | Freq: Every day | ORAL | 0 refills | Status: DC

## 2017-09-14 MED ORDER — INDOMETHACIN 25 MG PO CAPS: 25.0000 mg | ORAL_CAPSULE | Freq: Two times a day (BID) | ORAL | Status: DC | PRN
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
## 2017-09-14 NOTE — Progress Notes (Signed)
D.  Pt pleasant on approach, complaint of continued gout pain in right foot.  Pt states it is somewhat better than it had been and that the medication is helping.  Pt was positive for evening wrap up group, observed engaged in appropriate interaction with peers on the unit.  Pt denies SI/HI/AVH at this time.  Pt requested his scheduled Vistaril at 2000 rather than 2200 tonight.  A.  Support and encouragement offered, medication given as ordered.  R.  Pt remains safe on the unit, will continue to monitor.

## 2017-09-14 NOTE — Plan of Care (Signed)
Pt progressing in the following metrics  D: pt found in the dayroom interacting with his peers. Pt compliant with medication administration. Pt states he slept well last night. Pt rates his depression/hopelessness/anxiety a 4/8/6 out of 10 respectively. Pt rates his pain at a 3 out of 10 in his right great toe relating to his gout. Pt stated that this is subsiding now that he has his medication. Pt's goal for today is to stay calm and he will achieve this by staying positive. Pt denies any si/hi/ah/vh and verbally agrees to approach staff if these become apparent. Pt's medication was increased with his blood pressure continuing to trend high.  A: pt provided support and encouragement. Pt provided medications per protocol and standing orders. q5252m safety checks implemented and continued.  R: pt safe on the unit. Will continue to monitor.   Problem: Activity: Goal: Interest or engagement in activities will improve Outcome: Progressing Goal: Sleeping patterns will improve Outcome: Progressing   Problem: Coping: Goal: Ability to verbalize frustrations and anger appropriately will improve Outcome: Progressing Goal: Ability to demonstrate self-control will improve Outcome: Progressing   Problem: Health Behavior/Discharge Planning: Goal: Compliance with treatment plan for underlying cause of condition will improve Outcome: Progressing   Problem: Safety: Goal: Periods of time without injury will increase Outcome: Progressing   Problem: Education: Goal: Knowledge of the prescribed therapeutic regimen will improve Outcome: Progressing   Problem: Activity: Goal: Interest or engagement in leisure activities will improve Outcome: Progressing Goal: Imbalance in normal sleep/wake cycle will improve Outcome: Progressing   Problem: Coping: Goal: Will verbalize feelings Outcome: Progressing   Problem: Health Behavior/Discharge Planning: Goal: Ability to make decisions will improve Outcome:  Progressing Goal: Compliance with therapeutic regimen will improve Outcome: Progressing   Problem: Role Relationship: Goal: Will demonstrate positive changes in social behaviors and relationships Outcome: Progressing   Problem: Safety: Goal: Ability to disclose and discuss suicidal ideas will improve Outcome: Progressing   Problem: Self-Concept: Goal: Will verbalize positive feelings about self Outcome: Progressing

## 2017-09-14 NOTE — [facility] Group Notes (Signed)
[facility] LCSW Group Therapy Note  Date/Time:  09/14/2017 9:00-10:00 or 10:00-11:00AM  Type of Therapy and Topic:  Group Therapy:  Healthy and Unhealthy Supports  Participation Level:  Active   Description of Group:  Patients in this group were introduced to the idea of adding a variety of healthy supports to address the various needs in their lives.Patients discussed what additional healthy supports could be helpful in their recovery and wellness after discharge in order to prevent future hospitalizations.   An emphasis was placed on using counselor, doctor, therapy groups, 12-step groups, and problem-specific support groups to expand supports.  They also worked as a group on developing a specific plan for several patients to deal with unhealthy supports through boundary-setting, psychoeducation with loved ones, and even termination of relationships.   Therapeutic Goals:   1)  discuss importance of adding supports to stay well once out of the hospital  2)  compare healthy versus unhealthy supports and identify some examples of each  3)  generate ideas and descriptions of healthy supports that can be added  4)  offer mutual support about how to address unhealthy supports  5)  encourage active participation in and adherence to discharge plan    Summary of Patient Progress:  The patient stated that current healthy supports in his life are his younger sister who loves him unconditionally while current unhealthy supports include the rest of his family, "they are scumbags and hypocrites."  The patient expressed a willingness to add some support(s) to help in his recovery journey but was unspecific.   Therapeutic Modalities:   Motivational Interviewing Brief Solution-Focused Therapy  Ambrose MantleMareida Grossman-Orr, LCSW

## 2017-09-14 NOTE — Progress Notes (Signed)
[facility]  09/14/2017 11:35 AM [facility]  MRN:  [id number] Subjective: Patient is seen and examined.  Patient is a 47 year old male with a past psychiatric history significant for depression, anxiety, alcohol use disorder.  He seen in follow-up.  He is doing better today.  He stated that he slept better.  He stated that the pain in his foot is about 90% resolved.  As stated previously, his uric acid was normal, sedimentation rate was normal.  Plain films of his foot were negative yesterday.  He stated he spoke with his sister again yesterday and that the visit went better than the night before.  He is interested as his sister goes as a go between between his father and his brother in terms of setting up a support system to help him.  He denied any suicidal ideation today.  He denied any side effects to his current medications. Principal Problem: MDD (major depressive disorder), severe (HCC) Diagnosis:   Patient Active Problem List   Diagnosis Date Noted  . MDD (major depressive disorder), severe (HCC) [F32.2] 09/06/2017  . ANXIETY DISORDER [F41.1] 01/17/2009  . ALCOHOLISM [F10.20] 01/17/2009  . ESSENTIAL HYPERTENSION, BENIGN [I10] 01/17/2009   Total Time spent with patient: 20 minutes  Past Psychiatric History: See admission H&P  Past Medical History:  Past Medical History:  Diagnosis Date  . Gout   . Hypertension    History reviewed. No pertinent surgical history. Family History: History reviewed. No pertinent family history. Family Psychiatric  History: See admission H&P Social History:  Social History   Substance and Sexual Activity  Alcohol Use Yes   Comment: 50-60 beers a week     Social History   Substance and Sexual Activity  Drug Use Not Currently    Social History   Socioeconomic History  . Marital status: Single    Spouse name: Not on file  . Number of children: Not on file  . Years of education: Not on file  . Highest education level: Not on file   Occupational History  . Not on file  Social Needs  . Financial resource strain: Not on file  . Food insecurity:    Worry: Not on file    Inability: Not on file  . Transportation needs:    Medical: Not on file    Non-medical: Not on file  Tobacco Use  . Smoking status: Never Smoker  . Smokeless tobacco: Never Used  Substance and Sexual Activity  . Alcohol use: Yes    Comment: 50-60 beers a week  . Drug use: Not Currently  . Sexual activity: Not on file  Lifestyle  . Physical activity:    Days per week: Not on file    Minutes per session: Not on file  . Stress: Not on file  Relationships  . Social connections:    Talks on phone: Not on file    Gets together: Not on file    Attends religious service: Not on file    Active member of club or organization: Not on file    Attends meetings of clubs or organizations: Not on file    Relationship status: Not on file  Other Topics Concern  . Not on file  Social History Narrative  . Not on file   Additional Social History:    History of alcohol / drug use?: No history of alcohol / drug abuse  Sleep: Good  Appetite:  Good  Current Medications: Current Facility-Administered Medications  Medication Dose Route Frequency Provider Last Rate Last Dose  . acetaminophen (TYLENOL) tablet 650 mg  650 mg Oral Q6H PRN Money, Gerlene Burdock, FNP   650 mg at 09/12/17 1823  . busPIRone (BUSPAR) tablet 5 mg  5 mg Oral BID Antonieta Pert, MD   5 mg at 09/14/17 0758  . FLUoxetine (PROZAC) capsule 30 mg  30 mg Oral Daily Antonieta Pert, MD   30 mg at 09/14/17 0758  . hydrOXYzine (ATARAX/VISTARIL) tablet 25 mg  25 mg Oral Q6H PRN Nira Conn A, NP   25 mg at 09/11/17 2121  . hydrOXYzine (ATARAX/VISTARIL) tablet 50 mg  50 mg Oral QHS Antonieta Pert, MD   50 mg at 09/13/17 2010  . indomethacin (INDOCIN) capsule 50 mg  50 mg Oral TID PRN Oneta Rack, NP   50 mg at 09/14/17 0626  . lisinopril (PRINIVIL,ZESTRIL)  tablet 5 mg  5 mg Oral Daily Cobos, Rockey Situ, MD   5 mg at 09/14/17 0758  . multivitamin with minerals tablet 1 tablet  1 tablet Oral Daily Cobos, Rockey Situ, MD   1 tablet at 09/14/17 0758  . pantoprazole (PROTONIX) EC tablet 40 mg  40 mg Oral Daily Nira Conn A, NP   40 mg at 09/14/17 0758  . thiamine (VITAMIN B-1) tablet 100 mg  100 mg Oral Daily Cobos, Rockey Situ, MD   100 mg at 09/14/17 0758  . traZODone (DESYREL) tablet 100 mg  100 mg Oral QHS PRN Oneta Rack, NP   100 mg at 09/13/17 2107    Lab Results: No results found for this or any previous visit (from the past 48 hour(s)).  Blood Alcohol level:  Lab Results  Component Value Date   ETH <10 09/05/2017    Metabolic Disorder Labs: No results found for: HGBA1C, MPG No results found for: PROLACTIN No results found for: CHOL, TRIG, HDL, CHOLHDL, VLDL, LDLCALC  Physical Findings: AIMS: Facial and Oral Movements Muscles of Facial Expression: None, normal Lips and Perioral Area: None, normal Jaw: None, normal Tongue: None, normal,Extremity Movements Upper (arms, wrists, hands, fingers): None, normal Lower (legs, knees, ankles, toes): None, normal, Trunk Movements Neck, shoulders, hips: None, normal, Overall Severity Severity of abnormal movements (highest score from questions above): None, normal Incapacitation due to abnormal movements: None, normal Patient's awareness of abnormal movements (rate only patient's report): No Awareness, Dental Status Current problems with teeth and/or dentures?: No Does patient usually wear dentures?: No  CIWA:  CIWA-Ar Total: 2 COWS:  COWS Total Score: 2  Musculoskeletal: Strength & Muscle Tone: within normal limits Gait & Station: normal Patient leans: N/A  Psychiatric Specialty Exam: Physical Exam  Nursing note and vitals reviewed. Constitutional: He is oriented to person, place, and time. He appears well-developed and well-nourished.  HENT:  Head: Normocephalic and  atraumatic.  Respiratory: Effort normal.  Neurological: He is alert and oriented to person, place, and time.    ROS  Blood pressure 133/71, pulse 76, temperature 97.8 F (36.6 C), temperature source Oral, resp. rate 18, height 6' (1.829 m), weight 83.9 kg (185 lb), SpO2 96 %.Body mass index is 25.09 kg/m.  General Appearance: Casual  Eye Contact:  Fair  Speech:  Normal Rate  Volume:  Normal  Mood:  Anxious  Affect:  Congruent  Thought Process:  Coherent  Orientation:  Full (Time, Place, and Person)  Thought Content:  Logical  Suicidal Thoughts:  No  Homicidal Thoughts:  No  Memory:  Immediate;   Fair Recent;   Fair Remote;   Fair  Judgement:  Intact  Insight:  Fair  Psychomotor Activity:  Normal  Concentration:  Concentration: Fair and Attention Span: Fair  Recall:  [employer] of Knowledge:  Fair  Language:  Good  Akathisia:  Negative  Handed:  Right  AIMS (if indicated):     Assets:  Communication Skills Desire for Improvement Housing Physical Health Resilience Social Support  ADL's:  Intact  Cognition:  WNL  Sleep:  Number of Hours: 6.75     Treatment Plan Summary: Daily contact with patient to assess and evaluate symptoms and progress in treatment, Medication management and Plan Patient is seen and examined.  Patient has a history of depression, anxiety and alcohol use disorder.  He is doing better today.  We will continue his BuSpar 5 mg but change it to 3 times a day.  His fluoxetine will remain at 30 mg p.o. daily.  He stated his pain in his foot is better and will continue the indomethacin for now.  We will also continue the lisinopril.  He felt like the trazodone was helping sleep, and none of that will be changed.  Hopefully be able to get him out of the [facility] in 1 to 2 days.  Antonieta Pert, MD 09/14/2017, 11:35 AM

## 2017-09-15 LAB — BASIC METABOLIC PANEL
Anion gap: 7 (ref 5–15)
BUN: 11 mg/dL (ref 6–20)
CALCIUM: 9.3 mg/dL (ref 8.9–10.3)
CO2: 29 mmol/L (ref 22–32)
CREATININE: 1.08 mg/dL (ref 0.61–1.24)
Chloride: 107 mmol/L (ref 98–111)
GFR calc non Af Amer: >60 mL/min (ref >60)
GLUCOSE: 112 mg/dL — AB (ref 70–99)
Potassium: 4.3 mmol/L (ref 3.5–5.1)
Sodium: 143 mmol/L (ref 135–145)

## 2017-09-15 MED ORDER — FLUOXETINE HCL 10 MG PO CAPS: 30.0000 mg | ORAL_CAPSULE | Freq: Every day | ORAL | Status: DC

## 2017-09-15 NOTE — Plan of Care (Signed)
Discharge Note  Patient verbalizes readiness for discharge. Follow up plan explained, AVS, Transition record and SRA given. Prescriptions and teaching provided. Belongings returned and signed for. Suicide safety plan completed and signed. Patient verbalizes understanding. Patient denies SI/HI and assures this Clinical research associatewriter he will seek assistance should that change. Patient discharged to lobby and was receiving a ride with another patient.  Problem: Education: Goal: Knowledge of [facility] Education information/materials will improve Outcome: Adequate for Discharge Goal: Emotional status will improve Outcome: Adequate for Discharge Goal: Mental status will improve Outcome: Adequate for Discharge Goal: Verbalization of understanding the information provided will improve Outcome: Adequate for Discharge   Problem: Activity: Goal: Interest or engagement in activities will improve Outcome: Adequate for Discharge Goal: Sleeping patterns will improve Outcome: Adequate for Discharge   Problem: Coping: Goal: Ability to verbalize frustrations and anger appropriately will improve Outcome: Adequate for Discharge Goal: Ability to demonstrate self-control will improve Outcome: Adequate for Discharge   Problem: Health Behavior/Discharge Planning: Goal: Identification of resources available to assist in meeting health care needs will improve Outcome: Adequate for Discharge Goal: Compliance with treatment plan for underlying cause of condition will improve Outcome: Adequate for Discharge   Problem: Physical Regulation: Goal: Ability to maintain clinical measurements within normal limits will improve Outcome: Adequate for Discharge   Problem: Safety: Goal: Periods of time without injury will increase Outcome: Adequate for Discharge   Problem: Education: Goal: Utilization of techniques to improve thought processes will improve Outcome: Adequate for Discharge Goal: Knowledge of the  prescribed therapeutic regimen will improve Outcome: Adequate for Discharge   Problem: Activity: Goal: Interest or engagement in leisure activities will improve Outcome: Adequate for Discharge Goal: Imbalance in normal sleep/wake cycle will improve Outcome: Adequate for Discharge   Problem: Coping: Goal: Coping ability will improve Outcome: Adequate for Discharge Goal: Will verbalize feelings Outcome: Adequate for Discharge   Problem: Health Behavior/Discharge Planning: Goal: Ability to make decisions will improve Outcome: Adequate for Discharge Goal: Compliance with therapeutic regimen will improve Outcome: Adequate for Discharge   Problem: Role Relationship: Goal: Will demonstrate positive changes in social behaviors and relationships Outcome: Adequate for Discharge   Problem: Safety: Goal: Ability to disclose and discuss suicidal ideas will improve Outcome: Adequate for Discharge Goal: Ability to identify and utilize support systems that promote safety will improve Outcome: Adequate for Discharge   Problem: Self-Concept: Goal: Will verbalize positive feelings about self Outcome: Adequate for Discharge Goal: Level of anxiety will decrease Outcome: Adequate for Discharge   Problem: Self-Concept: Goal: Ability to disclose and discuss suicidal ideas will improve Outcome: Adequate for Discharge Goal: Will verbalize positive feelings about self Outcome: Adequate for Discharge

## 2017-09-15 NOTE — Progress Notes (Signed)
  [facility] :  Will you be returning to the same living situation after discharge:  Yes,  with sister At discharge, do you have transportation home?: Yes,  friend Do you have the ability to pay for your medications: No. [facility]  Release of information consent forms completed and in the chart;  Patient's signature needed at discharge.  Patient to Follow up at: Follow-up Information    [facility] [employer] Go in 3 day(s).   Specialty:  Professional Counselor Why:  Please attend a walk in appt between 8:30am-2:30pm, Monday-Friday.  Please request therapy and medication management services. Contact information: [facility] [address] [place] [address] [phone]           Next level of care provider has access to [facility] Link:no  Safety Planning and Suicide Prevention discussed: Yes,  with sister  Have you used any form of tobacco in the last 30 days? (Cigarettes, Smokeless Tobacco, Cigars, and/or Pipes): No  Has patient been referred to the Quitline?: N/A patient is not a smoker  Patient has been referred for addiction treatment: Pt. refused referral  Lorri FrederickWierda, Aymee Fomby Jon, LCSW 09/15/2017, 11:35 AM

## 2017-09-15 NOTE — Progress Notes (Signed)
D.  Pt pleasant on approach, complaint of stomach upset.  Pt states that he usually takes Indocin in 25 mg doses twice a day.  Pt states it is tearing up his stomach.  Pt requested medication for this.  Pt was positive for evening wrap up group, observed engaged in appropriate interaction with peers on the unit.  Pt denies SI/HI/AVH at this time.  Pt's sister brought his electric beard trimmer up here at his request, but did not pack all of the parts or his charger.  Pt upset at this and requested that it be locked in his locker, this was done and placed in locker 47.    A.  PA notified of Pt's stomach upset and came out to speak with Pt.  New orders received and carried out.  Support and encouragement offered to Pt.  R.  Pt remains safe on the unit, will continue to monitor.

## 2017-09-15 NOTE — BHH Suicide Risk Assessment (Signed)
[facility]: [facility] Discharge Suicide Risk Assessment   Principal Problem: MDD (major depressive disorder), severe [facility](HCC) Discharge Diagnoses:  Patient Active Problem List   Diagnosis Date Noted  . MDD (major depressive disorder), severe (HCC) [F32.2] 09/06/2017  . ANXIETY DISORDER [F41.1] 01/17/2009  . ALCOHOLISM [F10.20] 01/17/2009  . ESSENTIAL HYPERTENSION, BENIGN [I10] 01/17/2009    Total Time spent with patient: 30 minutes  Musculoskeletal: Strength & Muscle Tone: within normal limits Gait & Station: normal Patient leans: N/A  Psychiatric Specialty Exam: Review of Systems  All other systems reviewed and are negative.   Blood pressure (!) 152/125, pulse 76, temperature 97.8 F (36.6 C), temperature source Oral, resp. rate 18, height 6' (1.829 m), weight 83.9 kg (185 lb), SpO2 96 %.Body mass index is 25.09 kg/m.  General Appearance: Casual  Eye Contact::  Fair  Speech:  Normal Rate409  Volume:  Normal  Mood:  Anxious  Affect:  Congruent  Thought Process:  Coherent  Orientation:  Full (Time, Place, and Person)  Thought Content:  Logical  Suicidal Thoughts:  No  Homicidal Thoughts:  No  Memory:  Immediate;   Fair Recent;   Fair Remote;   Fair  Judgement:  Intact  Insight:  Fair  Psychomotor Activity:  Normal  Concentration:  Fair  Recall:  [employer]  Fund of Knowledge:Fair  Language: Fair  Akathisia:  Negative  Handed:  Right  AIMS (if indicated):     Assets:  Desire for Improvement Housing Leisure Time Physical Health Resilience Social Support  Sleep:  Number of Hours: 6  Cognition: WNL  ADL's:  Intact   Mental Status Per Nursing Assessment::   On Admission:  Suicidal ideation indicated by patient  Demographic Factors:  Male, Divorced or widowed, Caucasian, Low socioeconomic status and Unemployed  Loss Factors: NA  Historical Factors: Impulsivity  Risk Reduction Factors:   Living with another person, especially a relative  Continued Clinical Symptoms:   Depression:   Impulsivity Alcohol/Substance Abuse/Dependencies  Cognitive Features That Contribute To Risk:  None    Suicide Risk:  Minimal: No identifiable suicidal ideation.  Patients presenting with no risk factors but with morbid ruminations; may be classified as minimal risk based on the severity of the depressive symptoms  Follow-up Information    [facility] in [facility] day(s).   Specialty:  Professional Counselor Why:  Please attend a walk in appt between 8:30am-2:30pm, Monday-Friday.  Please request therapy and medication management services. Contact information: [facility] [address] [place] [address] [phone]           Plan Of Care/Follow-up recommendations:  Activity:  ad lib  Antonieta PertGreg Lawson Clary, MD 09/15/2017, 7:53 AM

## 2017-09-15 NOTE — Progress Notes (Signed)
Adult Psychoeducational Group Note  Date:  09/15/2017 Time:  1:32 AM  Group Topic/Focus:  Wrap-Up Group:   The focus of this group is to help patients review their daily goal of treatment and discuss progress on daily workbooks.  Participation Level:  Active  Participation Quality:  Appropriate  Affect:  Appropriate  Cognitive:  Alert  Insight: Appropriate  Engagement in Group:  Engaged  Modes of Intervention:  Discussion  Additional Comments:  Pt achieved his goal of getting the unit to sing Happy Birthday to another pt on the hall.  Pt also enjoyed the visit from his parents today.  Pt rated the day at 9/10.  Ellajane Stong 09/15/2017, 1:32 AM

## 2017-09-23 ENCOUNTER — Emergency Department (HOSPITAL_COMMUNITY)
Admission: EM | Admit: 2017-09-23 | Discharge: 2017-09-23 | Disposition: A | Discharge status: Home / Self Care | Payer: Self-pay | Attending: Emergency Medicine | Admitting: Emergency Medicine

## 2017-09-23 ENCOUNTER — Encounter (HOSPITAL_COMMUNITY): Payer: Self-pay | Admitting: Emergency Medicine

## 2017-09-23 ENCOUNTER — Other Ambulatory Visit: Payer: Self-pay

## 2017-09-23 DIAGNOSIS — Z79899 Other long term (current) drug therapy: Secondary | ICD-10-CM | POA: Insufficient documentation

## 2017-09-23 DIAGNOSIS — F1020 Alcohol dependence, uncomplicated: Secondary | ICD-10-CM | POA: Insufficient documentation

## 2017-09-23 DIAGNOSIS — J069 Acute upper respiratory infection, unspecified: Secondary | ICD-10-CM | POA: Insufficient documentation

## 2017-09-23 DIAGNOSIS — I10 Essential (primary) hypertension: Secondary | ICD-10-CM | POA: Insufficient documentation

## 2017-09-23 DIAGNOSIS — F419 Anxiety disorder, unspecified: Secondary | ICD-10-CM | POA: Insufficient documentation

## 2017-09-23 DIAGNOSIS — F329 Major depressive disorder, single episode, unspecified: Secondary | ICD-10-CM | POA: Insufficient documentation

## 2017-09-23 MED ORDER — DEXAMETHASONE SODIUM PHOSPHATE 10 MG/ML IJ SOLN
10.0000 mg | Freq: Once | INTRAMUSCULAR | Status: AC
  Administered 2017-09-23: 10 mg via INTRAMUSCULAR
  Filled 2017-09-23: qty 1

## 2017-09-23 MED ORDER — CHLORPHENIRAMINE-PHENYLEPHRINE 4-10 MG PO TABS: 1.0000 | ORAL_TABLET | ORAL | 0 refills | Status: DC | PRN

## 2017-09-23 MED ORDER — KETOROLAC TROMETHAMINE 30 MG/ML IJ SOLN
30.0000 mg | Freq: Once | INTRAMUSCULAR | Status: AC
  Administered 2017-09-23: 30 mg via INTRAVENOUS
  Filled 2017-09-23: qty 1

## 2017-09-23 NOTE — ED Provider Notes (Signed)
[facility]ERGENCY [facility]PART[facility] Provider Note   CSN: [id number] Arrival date & time: 09/23/17  0840     History   Chief Complaint Chief Complaint  Patient presents with  . Sore Throat    HPI James Fisher is a 47 y.o. male.  HPI  Patient presents today with headache, sore throat, ear fullness. He denies sick contacts.  Denies fever but endorses chills.  He states he woke up with a sore throat last Thursday, and felt short of breath due to the feeling of fullness in his throat.  He states he looked in the mirror and felt that his tonsils were swollen.  He has been taking DayQuil and Advil since Thursday.  He states he also had decreased hearing in his left ear beginning on Sunday as well as ringing in his ear and the feeling of "water in my ear. " He also states he has been coughing up "thick green mucus."His headache feels like a pressure and squeezing sensation over bilateral temples.  He also also been taking many Excedrin Migraine per day.  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  He states this was many years ago and he has received isoniazid treatment as well.   Past Medical History:  Diagnosis Date  . Gout   . Hypertension     Patient Active Problem List   Diagnosis Date Noted  . MDD (major depressive disorder), severe (HCC) 09/06/2017  . ANXIETY DISORDER 01/17/2009  . ALCOHOLISM 01/17/2009  . ESSENTIAL HYPERTENSION, BENIGN 01/17/2009    History reviewed. No pertinent surgical history.      Home Medications    Prior to Admission medications   Medication Sig Start Date End Date Taking? Authorizing Provider  busPIRone (BUSPAR) 5 MG tablet Take 1 tablet (5 mg total) by mouth 3 (three) times daily. 09/14/17  Yes Starkes, Juel Burrowakia S, FNP  FLUoxetine (PROZAC) 10 MG capsule Take 3 capsules (30 mg total) by mouth daily. 09/15/17  Yes Starkes, Juel Burrowakia S, FNP  hydrOXYzine (ATARAX/VISTARIL) 25 MG tablet Take  1 tablet (25 mg total) by mouth every 6 (six) hours as needed for anxiety. 09/14/17  Yes Starkes, Juel Burrowakia S, FNP  hydrOXYzine (ATARAX/VISTARIL) 50 MG tablet Take 1 tablet (50 mg total) by mouth at bedtime. 09/14/17  Yes Starkes, Juel Burrowakia S, FNP  lisinopril (PRINIVIL,ZESTRIL) 10 MG tablet Take 1 tablet (10 mg total) by mouth daily. 09/15/17  Yes Starkes, Juel Burrowakia S, FNP  traZODone (DESYREL) 100 MG tablet Take 1 tablet (100 mg total) by mouth at bedtime as needed for sleep (please give at 9 PM). 09/14/17  Yes Starkes, Juel Burrowakia S, FNP  Chlorpheniramine-Phenylephrine 4-10 MG tablet Take 1 tablet by mouth every 4 (four) hours as needed for congestion. 09/23/17   Garth Bignessimberlake, Kathryn, MD  pantoprazole (PROTONIX) 40 MG tablet Take 1 tablet (40 mg total) by mouth daily. Patient not taking: Reported on 09/23/2017 09/15/17   Truman HaywardStarkes, Takia S, FNP    Family History History reviewed. No pertinent family history.  Social History Social History   Tobacco Use  . Smoking status: Never Smoker  . Smokeless tobacco: Never Used  Substance Use Topics  . Alcohol use: Yes    Comment: 50-60 beers a week  . Drug use: Not Currently     Allergies   Bee venom; Onion; and Mushroom extract complex   Review of Systems Review of Systems  Constitutional: Positive for chills. Negative for fever.  HENT: Positive  for congestion, rhinorrhea, sinus pressure, sore throat and tinnitus.   Respiratory: Positive for cough. Negative for wheezing.   Cardiovascular: Negative for chest pain.  Gastrointestinal: Negative for abdominal pain.  Skin: Negative for rash.  All other systems reviewed and are negative.    Physical Exam Updated Vital Signs BP (!) 133/94   Pulse 69   Temp 98.3 F (36.8 C)   Resp 18   Ht 6\' 1"  (1.854 m)   Wt 86.2 kg (190 lb)   SpO2 97%   BMI 25.07 kg/m   Physical Exam  Constitutional: He is oriented to person, place, and time. He appears well-developed and well-nourished. He does not appear ill. No  distress.  HENT:  Head: Normocephalic and atraumatic.  Right Ear: Tympanic membrane and ear canal normal. No middle ear effusion.  Left Ear: Tympanic membrane normal.  No middle ear effusion.  Mouth/Throat: Uvula is midline and mucous membranes are normal. Posterior oropharyngeal erythema present. No posterior oropharyngeal edema. No tonsillar exudate.  L canal mildly erythematous, patient states used Qtip prior to arrival.   Eyes: EOM are normal.  Neck: Normal range of motion. Neck supple.  Cardiovascular: Normal rate.  Pulmonary/Chest: Effort normal and breath sounds normal. He has no wheezes.  Abdominal: Soft. Bowel sounds are normal.  Neurological: He is alert and oriented to person, place, and time.  Skin: Skin is warm and dry. Capillary refill takes less than 2 seconds. No rash noted.     ED Treatments / Results  Labs (all labs ordered are listed, but only abnormal results are displayed) Labs Reviewed - No data to display  EKG None  Radiology No results found.  Procedures Procedures (including critical care time)  Medications Ordered in ED Medications  ketorolac (TORADOL) 30 MG/ML injection 30 mg (30 mg Intravenous Given 09/23/17 1020)  dexamethasone (DECADRON) injection 10 mg (10 mg Intramuscular Given 09/23/17 1020)     Initial Impression / Assessment and Plan / ED Course  I have reviewed the triage vital signs and the nursing notes.  Pertinent labs & imaging results that were available during my care of the patient were reviewed by me and considered in my medical decision making (see chart for details).     Exam consistent with viral URI with associated headache.  Does have some frontal sinus tenderness, however duration is not appropriate for acute bacterial sinusitis.  Will give Toradol and Decadron in the department for pain relief, discharged with an histamine and decongestant.  Return if worsening or further concerns  Final Clinical Impressions(s) / ED  Diagnoses   Final diagnoses:  Viral upper respiratory tract infection    ED Discharge Orders        Ordered    Chlorpheniramine-Phenylephrine 4-10 MG tablet  Every 4 hours PRN     09/23/17 0936       Garth Bigness, MD 09/23/17 1219    Gerhard Munch, MD 09/23/17 [phone]

## 2017-09-23 NOTE — Discharge Instructions (Addendum)
You have a viral infection causing your sore throat. Please use the medication we sent and return if further concerns with shortness of breath or fever. It takes 7-10 days for the average virus to resolve.

## 2017-09-23 NOTE — ED Triage Notes (Signed)
Patient complaining of sore throat, left ear pain, cough, and congestion since Saturday.

## 2017-09-24 ENCOUNTER — Encounter (HOSPITAL_COMMUNITY): Payer: Self-pay

## 2017-09-24 ENCOUNTER — Emergency Department (HOSPITAL_COMMUNITY): Payer: Self-pay

## 2017-09-24 ENCOUNTER — Other Ambulatory Visit: Payer: Self-pay

## 2017-09-24 ENCOUNTER — Inpatient Hospital Stay (HOSPITAL_COMMUNITY)
Admission: EM | Admit: 2017-09-24 | Discharge: 2017-09-29 | DRG: 603 | Disposition: A | Discharge status: Home / Self Care | Payer: Self-pay | Attending: Internal Medicine | Admitting: Internal Medicine

## 2017-09-24 DIAGNOSIS — F322 Major depressive disorder, single episode, severe without psychotic features: Secondary | ICD-10-CM | POA: Diagnosis present

## 2017-09-24 DIAGNOSIS — R519 Headache, unspecified: Secondary | ICD-10-CM

## 2017-09-24 DIAGNOSIS — F411 Generalized anxiety disorder: Secondary | ICD-10-CM | POA: Diagnosis present

## 2017-09-24 DIAGNOSIS — H9192 Unspecified hearing loss, left ear: Secondary | ICD-10-CM | POA: Diagnosis present

## 2017-09-24 DIAGNOSIS — J320 Chronic maxillary sinusitis: Secondary | ICD-10-CM

## 2017-09-24 DIAGNOSIS — J0100 Acute maxillary sinusitis, unspecified: Secondary | ICD-10-CM | POA: Diagnosis present

## 2017-09-24 DIAGNOSIS — F1020 Alcohol dependence, uncomplicated: Secondary | ICD-10-CM | POA: Diagnosis present

## 2017-09-24 DIAGNOSIS — F329 Major depressive disorder, single episode, unspecified: Secondary | ICD-10-CM | POA: Diagnosis present

## 2017-09-24 DIAGNOSIS — R51 Headache: Secondary | ICD-10-CM

## 2017-09-24 DIAGNOSIS — K219 Gastro-esophageal reflux disease without esophagitis: Secondary | ICD-10-CM | POA: Diagnosis present

## 2017-09-24 DIAGNOSIS — R112 Nausea with vomiting, unspecified: Secondary | ICD-10-CM | POA: Diagnosis present

## 2017-09-24 DIAGNOSIS — H70009 Acute mastoiditis without complications, unspecified ear: Secondary | ICD-10-CM | POA: Diagnosis present

## 2017-09-24 DIAGNOSIS — L03213 Periorbital cellulitis: Principal | ICD-10-CM | POA: Diagnosis present

## 2017-09-24 DIAGNOSIS — I10 Essential (primary) hypertension: Secondary | ICD-10-CM | POA: Diagnosis present

## 2017-09-24 DIAGNOSIS — T402X5A Adverse effect of other opioids, initial encounter: Secondary | ICD-10-CM | POA: Diagnosis present

## 2017-09-24 HISTORY — DX: Depression, unspecified: F32.A

## 2017-09-24 HISTORY — DX: Major depressive disorder, single episode, unspecified: F32.9

## 2017-09-24 LAB — CBC WITH DIFFERENTIAL/PLATELET
BASOS PCT: 0 %
Basophils Absolute: 0 10*3/uL (ref 0.0–0.1)
EOS ABS: 0.1 10*3/uL (ref 0.0–0.7)
Eosinophils Relative: 1 %
HCT: 43 % (ref 39.0–52.0)
HEMOGLOBIN: 15.2 g/dL (ref 13.0–17.0)
LYMPHS ABS: 3.1 10*3/uL (ref 0.7–4.0)
Lymphocytes Relative: 24 %
MCH: 32.1 pg (ref 26.0–34.0)
MCHC: 35.3 g/dL (ref 30.0–36.0)
MCV: 90.7 fL (ref 78.0–100.0)
Monocytes Absolute: 1.2 10*3/uL — ABNORMAL HIGH (ref 0.1–1.0)
Monocytes Relative: 9 %
NEUTROS PCT: 66 %
Neutro Abs: 8.5 10*3/uL — ABNORMAL HIGH (ref 1.7–7.7)
PLATELETS: 245 10*3/uL (ref 150–400)
RBC: 4.74 MIL/uL (ref 4.22–5.81)
RDW: 13 % (ref 11.5–15.5)
WBC: 12.9 10*3/uL — AB (ref 4.0–10.5)

## 2017-09-24 LAB — BASIC METABOLIC PANEL
ANION GAP: 11 (ref 5–15)
BUN: 13 mg/dL (ref 6–20)
CHLORIDE: 106 mmol/L (ref 98–111)
CO2: 24 mmol/L (ref 22–32)
Calcium: 9.4 mg/dL (ref 8.9–10.3)
Creatinine, Ser: 1.15 mg/dL (ref 0.61–1.24)
Glucose, Bld: 119 mg/dL — ABNORMAL HIGH (ref 70–99)
POTASSIUM: 3.2 mmol/L — AB (ref 3.5–5.1)
SODIUM: 141 mmol/L (ref 135–145)

## 2017-09-24 LAB — MAGNESIUM: MAGNESIUM: 2.2 mg/dL (ref 1.7–2.4)

## 2017-09-24 MED ORDER — HYDROMORPHONE HCL 1 MG/ML IJ SOLN
0.5000 mg | Freq: Once | INTRAMUSCULAR | Status: AC
  Administered 2017-09-24: 0.5 mg via INTRAVENOUS
  Filled 2017-09-24: qty 1

## 2017-09-24 MED ORDER — FENTANYL CITRATE (PF) 100 MCG/2ML IJ SOLN
100.0000 ug | Freq: Once | INTRAMUSCULAR | Status: AC
  Administered 2017-09-24: 100 ug via INTRAVENOUS
  Filled 2017-09-24: qty 2

## 2017-09-24 MED ORDER — MORPHINE SULFATE (PF) 4 MG/ML IV SOLN
4.0000 mg | INTRAVENOUS | Status: DC | PRN
  Administered 2017-09-24 – 2017-09-25 (×3): 4 mg via INTRAVENOUS
  Filled 2017-09-24 (×4): qty 1

## 2017-09-24 MED ORDER — ACETAMINOPHEN 325 MG PO TABS
650.0000 mg | ORAL_TABLET | Freq: Four times a day (QID) | ORAL | Status: DC | PRN
  Administered 2017-09-27 – 2017-09-29 (×4): 650 mg via ORAL
  Filled 2017-09-24 (×4): qty 2

## 2017-09-24 MED ORDER — DIPHENHYDRAMINE HCL 25 MG PO CAPS
25.0000 mg | ORAL_CAPSULE | Freq: Two times a day (BID) | ORAL | Status: DC
  Administered 2017-09-24 – 2017-09-29 (×10): 25 mg via ORAL
  Filled 2017-09-24 (×10): qty 1

## 2017-09-24 MED ORDER — ONDANSETRON HCL 4 MG PO TABS: 4.0000 mg | ORAL_TABLET | Freq: Four times a day (QID) | ORAL | Status: DC | PRN

## 2017-09-24 MED ORDER — GUAIFENESIN-DM 100-10 MG/5ML PO SYRP: 5.0000 mL | ORAL_SOLUTION | ORAL | Status: DC | PRN

## 2017-09-24 MED ORDER — LISINOPRIL 10 MG PO TABS
10.0000 mg | ORAL_TABLET | Freq: Every day | ORAL | Status: DC
  Administered 2017-09-25 – 2017-09-29 (×5): 10 mg via ORAL
  Filled 2017-09-24 (×5): qty 1

## 2017-09-24 MED ORDER — SODIUM CHLORIDE 0.9 % IV SOLN
100.0000 mg | Freq: Two times a day (BID) | INTRAVENOUS | Status: DC
  Administered 2017-09-25 – 2017-09-27 (×6): 100 mg via INTRAVENOUS
  Filled 2017-09-24 (×12): qty 100

## 2017-09-24 MED ORDER — ONDANSETRON HCL 4 MG/2ML IJ SOLN
4.0000 mg | Freq: Four times a day (QID) | INTRAMUSCULAR | Status: DC | PRN
  Administered 2017-09-27: 4 mg via INTRAVENOUS
  Filled 2017-09-24: qty 2

## 2017-09-24 MED ORDER — SODIUM CHLORIDE 0.9 % IV BOLUS
500.0000 mL | Freq: Once | INTRAVENOUS | Status: AC
  Administered 2017-09-24: 500 mL via INTRAVENOUS

## 2017-09-24 MED ORDER — METHYLPREDNISOLONE SODIUM SUCC 125 MG IJ SOLR
80.0000 mg | Freq: Once | INTRAMUSCULAR | Status: AC
  Administered 2017-09-24: 80 mg via INTRAVENOUS
  Filled 2017-09-24: qty 2

## 2017-09-24 MED ORDER — ENOXAPARIN SODIUM 40 MG/0.4ML ~~LOC~~ SOLN
40.0000 mg | SUBCUTANEOUS | Status: DC
  Administered 2017-09-24 – 2017-09-28 (×5): 40 mg via SUBCUTANEOUS
  Filled 2017-09-24 (×5): qty 0.4

## 2017-09-24 MED ORDER — MORPHINE SULFATE (PF) 4 MG/ML IV SOLN
4.0000 mg | Freq: Once | INTRAVENOUS | Status: AC
  Administered 2017-09-24: 4 mg via INTRAVENOUS
  Filled 2017-09-24: qty 1

## 2017-09-24 MED ORDER — IOHEXOL 300 MG/ML  SOLN
75.0000 mL | Freq: Once | INTRAMUSCULAR | Status: AC | PRN
  Administered 2017-09-24: 75 mL via INTRAVENOUS

## 2017-09-24 MED ORDER — ENSURE ENLIVE PO LIQD: 237.0000 mL | Freq: Two times a day (BID) | ORAL | Status: DC

## 2017-09-24 MED ORDER — TRAZODONE HCL 50 MG PO TABS
100.0000 mg | ORAL_TABLET | Freq: Every evening | ORAL | Status: DC | PRN
  Administered 2017-09-27 – 2017-09-28 (×2): 100 mg via ORAL
  Filled 2017-09-24 (×2): qty 2

## 2017-09-24 MED ORDER — ACETAMINOPHEN 650 MG RE SUPP: 650.0000 mg | Freq: Four times a day (QID) | RECTAL | Status: DC | PRN

## 2017-09-24 MED ORDER — POTASSIUM CHLORIDE CRYS ER 20 MEQ PO TBCR
40.0000 meq | EXTENDED_RELEASE_TABLET | Freq: Two times a day (BID) | ORAL | Status: DC
  Administered 2017-09-24: 40 meq via ORAL
  Filled 2017-09-24 (×2): qty 2

## 2017-09-24 MED ORDER — KETOROLAC TROMETHAMINE 30 MG/ML IJ SOLN
15.0000 mg | Freq: Once | INTRAMUSCULAR | Status: AC
  Administered 2017-09-24: 15 mg via INTRAVENOUS
  Filled 2017-09-24: qty 1

## 2017-09-24 MED ORDER — SODIUM CHLORIDE 0.9 % IV SOLN
3.0000 g | Freq: Once | INTRAVENOUS | Status: AC
  Administered 2017-09-24: 3 g via INTRAVENOUS
  Filled 2017-09-24: qty 3

## 2017-09-24 MED ORDER — BUSPIRONE HCL 5 MG PO TABS
5.0000 mg | ORAL_TABLET | Freq: Three times a day (TID) | ORAL | Status: DC
  Administered 2017-09-24 – 2017-09-29 (×9): 5 mg via ORAL
  Filled 2017-09-24 (×10): qty 1

## 2017-09-24 NOTE — ED Provider Notes (Signed)
[facility] EMERGENCY DEPARTMENT Provider Note   CSN: [id number] Arrival date & time: 09/24/17  1628     History   Chief Complaint Chief Complaint  Patient presents with  . URI    HPI James Fisher is a 47 y.o. male.  HPI Patient presents with left-sided headache.  States it is severe.  Seen in the ER yesterday and diagnosed with URI.  Around 5 days ago had sore throat.  Sore throat is improved.  States he had some drainage that is brown out of his nose.  States he now is much more severe pain in his ear with decreased hearing and pain in his left temple area.  Some pain with moving his eye.  No vision changes.  No nausea or vomiting.  No fevers.  Has not had pains like this before.  States there is swelling by his left ear also. Past Medical History:  Diagnosis Date  . Gout   . Hypertension     Patient Active Problem List   Diagnosis Date Noted  . MDD (major depressive disorder), severe (HCC) 09/06/2017  . ANXIETY DISORDER 01/17/2009  . ALCOHOLISM 01/17/2009  . ESSENTIAL HYPERTENSION, BENIGN 01/17/2009    History reviewed. No pertinent surgical history.      Home Medications    Prior to Admission medications   Medication Sig Start Date End Date Taking? Authorizing Provider  busPIRone (BUSPAR) 5 MG tablet Take 1 tablet (5 mg total) by mouth 3 (three) times daily. 09/14/17  Yes Starkes, Juel Burrow, FNP  Chlorpheniramine-Phenylephrine 4-10 MG tablet Take 1 tablet by mouth every 4 (four) hours as needed for congestion. 09/23/17  Yes Garth Bigness, MD  lisinopril (PRINIVIL,ZESTRIL) 10 MG tablet Take 1 tablet (10 mg total) by mouth daily. 09/15/17  Yes Starkes, Juel Burrow, FNP  traZODone (DESYREL) 100 MG tablet Take 1 tablet (100 mg total) by mouth at bedtime as needed for sleep (please give at 9 PM). 09/14/17  Yes Starkes, Juel Burrow, FNP  FLUoxetine (PROZAC) 10 MG capsule Take 3 capsules (30 mg total) by mouth daily. Patient not taking: Reported on 09/24/2017 09/15/17   Truman Hayward, FNP  hydrOXYzine (ATARAX/VISTARIL) 25 MG tablet Take 1 tablet (25 mg total) by mouth every 6 (six) hours as needed for anxiety. Patient not taking: Reported on 09/24/2017 09/14/17   Truman Hayward, FNP  hydrOXYzine (ATARAX/VISTARIL) 50 MG tablet Take 1 tablet (50 mg total) by mouth at bedtime. Patient not taking: Reported on 09/24/2017 09/14/17   Truman Hayward, FNP  pantoprazole (PROTONIX) 40 MG tablet Take 1 tablet (40 mg total) by mouth daily. Patient not taking: Reported on 09/23/2017 09/15/17   Truman Hayward, FNP    Family History No family history on file.  Social History Social History   Tobacco Use  . Smoking status: Never Smoker  . Smokeless tobacco: Never Used  Substance Use Topics  . Alcohol use: Yes    Comment: 50-60 beers a week  . Drug use: Not Currently     Allergies   Bee venom; Onion; and Mushroom extract complex   Review of Systems Review of Systems  Constitutional: Negative for appetite change.  HENT: Positive for ear pain, facial swelling, hearing loss and sore throat. Negative for ear discharge and trouble swallowing.   Eyes: Positive for discharge.  Respiratory: Negative for choking.   Cardiovascular: Negative for chest pain.  Gastrointestinal: Negative for abdominal pain.  Genitourinary: Negative for flank pain.  Musculoskeletal: Negative for back pain.  Neurological: Positive for headaches.  Hematological: Negative for adenopathy.  Psychiatric/Behavioral: Negative for confusion.     Physical Exam Updated Vital Signs BP (!) 171/102   Pulse 64   Temp 98.1 F (36.7 C) (Oral)   Resp 18   SpO2 96%   Physical Exam  Constitutional: He appears well-developed.  Patient appears uncomfortable  HENT:  Head: Normocephalic.  Tenderness over left temporal area.  There is bulging of the left superior external auditory canal.  Some erythema in the left external auditory canal.  Some tenderness over the left TMJ area with slight swelling.  No  mastoid tenderness.  Left TM does not show bulging or erythema.Posterior pharynx without erythema.  Eyes:  Conjunctival injection left without discharge.  Increased pain with looking to left but does have conjugate gaze.  No proptosis.  Neck: Neck supple.  Cardiovascular: Normal rate.  Pulmonary/Chest: Effort normal.  Abdominal: Soft.  Musculoskeletal: He exhibits no edema.  Neurological: He is alert.  Skin: Skin is warm. Capillary refill takes less than 2 seconds.     ED Treatments / Results  Labs (all labs ordered are listed, but only abnormal results are displayed) Labs Reviewed  CBC WITH DIFFERENTIAL/PLATELET - Abnormal; Notable for the following components:      Result Value   WBC 12.9 (*)    Neutro Abs 8.5 (*)    Monocytes Absolute 1.2 (*)    All other components within normal limits  BASIC METABOLIC PANEL - Abnormal; Notable for the following components:   Potassium 3.2 (*)    Glucose, Bld 119 (*)    All other components within normal limits    EKG None  Radiology Ct Temporal Bones W Contrast  Result Date: 09/24/2017 CLINICAL DATA:  Hearing loss, conductive. Seen in the emergency department for upper respiratory infection yesterday. Increased pressure in head. Now unable to hear from left ear. EXAM: CT TEMPORAL BONES WITH CONTRAST TECHNIQUE: Axial and coronal plane CT imaging of the petrous temporal bones was performed with thin-collimation image reconstruction after intravenous contrast administration. Multiplanar CT image reconstructions were also generated. CONTRAST:  75mL OMNIPAQUE IOHEXOL 300 MG/ML  SOLN COMPARISON:  CT head without contrast 07/26/2014 FINDINGS: Limited imaging of the brain is unremarkable. Focal enhancement is present. Fluid levels are present in the maxillary sinuses bilaterally. There is scattered mucosal thickening throughout the ethmoid air cells. The right external auditory canal is within limits. Tympanic membrane is visualized and within limits.  The right middle ear cavity is clear. Middle ear ossicles are normally formed and articulating. Oval window is patent. Epitympanum is clear. Mastoid air cells are clear. The inner ear structures are normally formed. 2.5 turns are present in the cochlea. The superior and lateral semicircular canals are covered. The internal auditory canal and vestibular aqueduct are normal. There is some mucosal thickening within the external auditory canal. Fluid is present within the left middle ear cavity medial and inferior to the ossicles. Ossicles are intact. Normal articulation is present. The oval window is patent. There is fluid in the epitympanum and mastoid air cells without osseous destruction. Extraosseous soft tissue or enhancement is present. The inner ear structures are normally formed. 2.5 turns are present in the cochlea. The superior and lateral semicircular canals are covered. The internal auditory canal and vestibular aqueduct are normal. IMPRESSION: 1. Fluid in the left middle ear cavity and mastoid air cells may represent otitis media and/or mastoiditis. This may represent a simple effusion. No osseous destruction is present. 2. Mild mucosal  thickening of the left external auditory canal supports an inflammatory process. 3. Fluid levels within the maxillary sinuses bilaterally compatible with acute sinusitis. Electronically Signed   By: Marin Roberts M.D.   On: 09/24/2017 19:26    Procedures Procedures (including critical care time)  Medications Ordered in ED Medications  sodium chloride 0.9 % bolus 500 mL (0 mLs Intravenous Stopped 09/24/17 1835)  fentaNYL (SUBLIMAZE) injection 100 mcg (100 mcg Intravenous Given 09/24/17 1755)  HYDROmorphone (DILAUDID) injection 0.5 mg (0.5 mg Intravenous Given 09/24/17 1832)  iohexol (OMNIPAQUE) 300 MG/ML solution 75 mL (75 mLs Intravenous Contrast Given 09/24/17 1849)  ketorolac (TORADOL) 30 MG/ML injection 15 mg (15 mg Intravenous Given 09/24/17 1938)      Initial Impression / Assessment and Plan / ED Course  I have reviewed the triage vital signs and the nursing notes.  Pertinent labs & imaging results that were available during my care of the patient were reviewed by me and considered in my medical decision making (see chart for details).     Patient with headache.  Severe.  Pain continues despite Dilaudid.  CT scan done and showed sinusitis.  Also some inflammatory changes around the ear.  Doubt mastoiditis as there is no tenderness over the mastoid.  However it has continued pain.  Will admit to hospitalist.  Final Clinical Impressions(s) / ED Diagnoses   Final diagnoses:  Maxillary sinusitis, unspecified chronicity    ED Discharge Orders    None       Benjiman Core, MD 09/24/17 2025

## 2017-09-24 NOTE — ED Notes (Signed)
Going to CT 

## 2017-09-24 NOTE — ED Triage Notes (Signed)
Pt was here yesterday and diagnosed with an URI. Woke up this morning with a swollen eye, pressure in his head, and is not able to hear out of his left ear. Pt has a history of hypertension and has taken BP meds today.

## 2017-09-24 NOTE — H&P (Signed)
History and Physical    James Fisher ZOX:[id number] DOB: June 19, 1970 DOA: 09/24/2017  PCP: Patient, No Pcp Per   Patient coming from: Home   Chief Complaint: Headache, Reduced hearing from left ear, swollen eye  HPI: James Fisher is a 47 y.o. male with medical history significant alcoholism, anxiety and depression, HTN who presented to the ED with c/o worsening left sided headache, reduced hearing from left ear, and swelling of his left eye with redness started this morning.  Patient reports eye movements mostly without pain unless he looks to the extreme left. Patient's other symptoms of greenish productive cough, sore throat, brownish drainage from his nose has been ongoing for the past week.  He denies fevers, reports chills.  Symptoms persisted despite several tablets of Excedrin, Advil and dayQuil.   Patient presented to the ED 09/23/17-with symptoms of sore throat and productive cough, headache-thought to have viral URI, given Toradol and Decadron, with antihistamine and decongestants, sent home on conservative management.  Persistent symptoms and worsening left sided headache he presented today.  Denies IV drug use.  ED Course: Elevated blood pressure.  WBC 12.9.  K 3.2.  Temporal bones CT with contrast-fluid in the left middle ear cavity and mastoid air cells may represent otitis media and or mastoiditis.  This may represent a simple effusion.  Mild mucosal thickening of the left external auditory canal supports an inflammatory process.  Fluid levels within the maxillary sinuses bilaterally compatible with acute sinusitis.  Patient was given IV Dilaudid Toradol and fentanyl in the ED still with persistent pain, hospitalist was called to admit for maxillary sinusitis with intractable pain.  Review of Systems: As per HPI all other systems reviewed and negative.  Past Medical History:  Diagnosis Date  . Gout   . Hypertension     History reviewed. No pertinent surgical history.   reports  that he has never smoked. He has never used smokeless tobacco. He reports that he drinks alcohol. He reports that he has current or past drug history.  Allergies  Allergen Reactions  . Bee Venom Swelling  . Onion Hives  . Mushroom Extract Complex Hives   Family history of hypertension in mother   Prior to Admission medications   Medication Sig Start Date End Date Taking? Authorizing Provider  busPIRone (BUSPAR) 5 MG tablet Take 1 tablet (5 mg total) by mouth 3 (three) times daily. 09/14/17  Yes Starkes, Juel Burrow, FNP  Chlorpheniramine-Phenylephrine 4-10 MG tablet Take 1 tablet by mouth every 4 (four) hours as needed for congestion. 09/23/17  Yes Garth Bigness, MD  lisinopril (PRINIVIL,ZESTRIL) 10 MG tablet Take 1 tablet (10 mg total) by mouth daily. 09/15/17  Yes Starkes, Juel Burrow, FNP  traZODone (DESYREL) 100 MG tablet Take 1 tablet (100 mg total) by mouth at bedtime as needed for sleep (please give at 9 PM). 09/14/17  Yes Starkes, Juel Burrow, FNP  FLUoxetine (PROZAC) 10 MG capsule Take 3 capsules (30 mg total) by mouth daily. Patient not taking: Reported on 09/24/2017 09/15/17   Truman Hayward, FNP  hydrOXYzine (ATARAX/VISTARIL) 25 MG tablet Take 1 tablet (25 mg total) by mouth every 6 (six) hours as needed for anxiety. Patient not taking: Reported on 09/24/2017 09/14/17   Truman Hayward, FNP  hydrOXYzine (ATARAX/VISTARIL) 50 MG tablet Take 1 tablet (50 mg total) by mouth at bedtime. Patient not taking: Reported on 09/24/2017 09/14/17   Truman Hayward, FNP  pantoprazole (PROTONIX) 40 MG tablet Take 1 tablet (40 mg total) by  mouth daily. Patient not taking: Reported on 09/23/2017 09/15/17   Truman HaywardStarkes, Takia S, FNP    Physical Exam: Vitals:   09/24/17 1700 09/24/17 1801 09/24/17 1830 09/24/17 1930  BP: (!) 173/111 (!) 186/112 (!) 170/104 (!) 171/102  Pulse: 95 87 83 64  Resp:  18    Temp:  98.1 F (36.7 C)    TempSrc:  Oral    SpO2: 98% 99% 95% 96%    Constitutional: NAD, calm,  comfortable Vitals:   09/24/17 1700 09/24/17 1801 09/24/17 1830 09/24/17 1930  BP: (!) 173/111 (!) 186/112 (!) 170/104 (!) 171/102  Pulse: 95 87 83 64  Resp:  18    Temp:  98.1 F (36.7 C)    TempSrc:  Oral    SpO2: 98% 99% 95% 96%   Eyes: PERRL, left eye- mild to moderate swelling upper and lower lids, with injected conjunctiva, no proptosis ENMT: Mucous membranes are moist. Posterior pharynx clear of any exudate or lesions.left ear-erythema distal part of external auditory canal, tympanic membrane could not be appreciated Neck: normal, supple, no masses, no thyromegaly Respiratory: clear to auscultation bilaterally, no wheezing, no crackles. Normal respiratory effort. No accessory muscle use.  Cardiovascular: Regular rate and rhythm, no murmurs / rubs / gallops. No extremity edema. 2+ pedal pulses. No carotid bruits.  Abdomen: no tenderness, no masses palpated. No hepatosplenomegaly. Bowel sounds positive.  Musculoskeletal: no clubbing / cyanosis. No joint deformity upper and lower extremities. Good ROM, no contractures. Normal muscle tone.  Skin: no rashes, lesions, ulcers. No induration Neurologic: CN 2-12 grossly intact. Sensation intact, DTR normal. Strength 5/5 in all 4.  Psychiatric: Normal judgment and insight. Alert and oriented x 3. Normal mood.   Labs on Admission: I have personally reviewed following labs and imaging studies  CBC: Recent Labs  Lab 09/24/17 1724  WBC 12.9*  NEUTROABS 8.5*  HGB 15.2  HCT 43.0  MCV 90.7  PLT 245   Basic Metabolic Panel: Recent Labs  Lab 09/24/17 1724  NA 141  K 3.2*  CL 106  CO2 24  GLUCOSE 119*  BUN 13  CREATININE 1.15  CALCIUM 9.4    Radiological Exams on Admission: Ct Temporal Bones W Contrast  Result Date: 09/24/2017 CLINICAL DATA:  Hearing loss, conductive. Seen in the emergency department for upper respiratory infection yesterday. Increased pressure in head. Now unable to hear from left ear. EXAM: CT TEMPORAL BONES  WITH CONTRAST TECHNIQUE: Axial and coronal plane CT imaging of the petrous temporal bones was performed with thin-collimation image reconstruction after intravenous contrast administration. Multiplanar CT image reconstructions were also generated. CONTRAST:  75mL OMNIPAQUE IOHEXOL 300 MG/ML  SOLN COMPARISON:  CT head without contrast 07/26/2014 FINDINGS: Limited imaging of the brain is unremarkable. Focal enhancement is present. Fluid levels are present in the maxillary sinuses bilaterally. There is scattered mucosal thickening throughout the ethmoid air cells. The right external auditory canal is within limits. Tympanic membrane is visualized and within limits. The right middle ear cavity is clear. Middle ear ossicles are normally formed and articulating. Oval window is patent. Epitympanum is clear. Mastoid air cells are clear. The inner ear structures are normally formed. 2.5 turns are present in the cochlea. The superior and lateral semicircular canals are covered. The internal auditory canal and vestibular aqueduct are normal. There is some mucosal thickening within the external auditory canal. Fluid is present within the left middle ear cavity medial and inferior to the ossicles. Ossicles are intact. Normal articulation is present. The oval  window is patent. There is fluid in the epitympanum and mastoid air cells without osseous destruction. Extraosseous soft tissue or enhancement is present. The inner ear structures are normally formed. 2.5 turns are present in the cochlea. The superior and lateral semicircular canals are covered. The internal auditory canal and vestibular aqueduct are normal. IMPRESSION: 1. Fluid in the left middle ear cavity and mastoid air cells may represent otitis media and/or mastoiditis. This may represent a simple effusion. No osseous destruction is present. 2. Mild mucosal thickening of the left external auditory canal supports an inflammatory process. 3. Fluid levels within the  maxillary sinuses bilaterally compatible with acute sinusitis. Electronically Signed   By: Marin Roberts M.D.   On: 09/24/2017 19:26    EKG: None  Assessment/Plan Principal Problem:   Maxillary sinusitis, acute Active Problems:   Anxiety state   Alcoholism (HCC)   Essential hypertension, benign   MDD (major depressive disorder), severe (HCC)  Acute maxillary sinusitis-likely 2/2  bacterial now superimposed on viral URTI.  With reduced hearing likely secondary to possible otitis media and or mastoiditis. WBC 12.9.  Stable vitals.  -Continue IV Unasyn started in the ED pharmacy consult -CBC a.m. -Mucolytics, Benadryl scheduled -IV Solu-Medrol 80 X 1, considering persistent pain -Morphine 4 mg every 4 hourly.  Preseptal cellulitis- with periorbital swelling, conjunctival injection, without proptosis.  Denies pain with eye movement, except mild pain with adduction to the left.  -Called radiologist on call, to review images if any suggestion of orbital cellulitis.  This images had to  be uploaded as initial temporal Ct did not have them- to avoid rescanning patient.  Radiologist on call-Dr. Chase Picket, no orbital cellulitis, mild left eyelid edema could indicate mild periorbital cellulitis. -Continue IV Unasyn, will add IV doxycycline.  Depression, anxiety- - continue home buspirone, hydroxyzine  Alcoholism Hx-3 bottles of beer, 3 days ago, otherwise reports occasional/social alcohol intake.  HIV as part of routine health screening  DVT prophylaxis: Lovenox Code Status: Full Family Communication: Patient's male friend at bedside. Disposition Plan: per rounding team Consults called: None Admission status: Inpt, med surg   Onnie Boer MD [facility] Pager [phone] From 3PM-11PM.  Otherwise please contact night-coverage [email or web address] Password [facility]  09/24/2017, 8:46 PM

## 2017-09-24 NOTE — ED Notes (Addendum)
Wrong chart

## 2017-09-25 DIAGNOSIS — F1020 Alcohol dependence, uncomplicated: Secondary | ICD-10-CM

## 2017-09-25 DIAGNOSIS — J320 Chronic maxillary sinusitis: Secondary | ICD-10-CM

## 2017-09-25 DIAGNOSIS — R51 Headache: Secondary | ICD-10-CM

## 2017-09-25 DIAGNOSIS — F411 Generalized anxiety disorder: Secondary | ICD-10-CM

## 2017-09-25 LAB — BASIC METABOLIC PANEL
Anion gap: 5 (ref 5–15)
BUN: 10 mg/dL (ref 6–20)
CO2: 29 mmol/L (ref 22–32)
CREATININE: 1.08 mg/dL (ref 0.61–1.24)
Calcium: 9.3 mg/dL (ref 8.9–10.3)
Chloride: 108 mmol/L (ref 98–111)
GFR calc non Af Amer: >60 mL/min (ref >60)
Glucose, Bld: 161 mg/dL — ABNORMAL HIGH (ref 70–99)
Potassium: 5.2 mmol/L — ABNORMAL HIGH (ref 3.5–5.1)
Sodium: 142 mmol/L (ref 135–145)

## 2017-09-25 LAB — CBC
HCT: 42.2 % (ref 39.0–52.0)
Hemoglobin: 14.3 g/dL (ref 13.0–17.0)
MCH: 31.3 pg (ref 26.0–34.0)
MCHC: 33.9 g/dL (ref 30.0–36.0)
MCV: 92.3 fL (ref 78.0–100.0)
Platelets: 246 10*3/uL (ref 150–400)
RBC: 4.57 MIL/uL (ref 4.22–5.81)
RDW: 13.2 % (ref 11.5–15.5)
WBC: 9 10*3/uL (ref 4.0–10.5)

## 2017-09-25 MED ORDER — SODIUM CHLORIDE 0.9 % IV SOLN
3.0000 g | Freq: Four times a day (QID) | INTRAVENOUS | Status: DC
  Administered 2017-09-25 – 2017-09-29 (×17): 3 g via INTRAVENOUS
  Filled 2017-09-25 (×21): qty 3

## 2017-09-25 MED ORDER — POLYETHYLENE GLYCOL 3350 17 G PO PACK
17.0000 g | PACK | Freq: Every day | ORAL | Status: DC
  Administered 2017-09-25 – 2017-09-29 (×3): 17 g via ORAL
  Filled 2017-09-25 (×3): qty 1

## 2017-09-25 MED ORDER — SENNA 8.6 MG PO TABS
1.0000 | ORAL_TABLET | Freq: Two times a day (BID) | ORAL | Status: DC
  Administered 2017-09-25 – 2017-09-29 (×9): 8.6 mg via ORAL
  Filled 2017-09-25 (×9): qty 1

## 2017-09-25 MED ORDER — HYDROMORPHONE HCL 1 MG/ML IJ SOLN
0.5000 mg | INTRAMUSCULAR | Status: DC | PRN
  Administered 2017-09-25 – 2017-09-26 (×6): 0.5 mg via INTRAVENOUS
  Filled 2017-09-25 (×6): qty 0.5

## 2017-09-25 MED ORDER — OXYCODONE HCL ER 10 MG PO T12A
10.0000 mg | EXTENDED_RELEASE_TABLET | Freq: Two times a day (BID) | ORAL | Status: DC
Start: 2017-09-25 — End: 2017-09-29
  Administered 2017-09-25 – 2017-09-29 (×9): 10 mg via ORAL
  Filled 2017-09-25 (×9): qty 1

## 2017-09-25 MED ORDER — SODIUM CHLORIDE 0.9 % IV SOLN
INTRAVENOUS | Status: AC
  Filled 2017-09-25: qty 3

## 2017-09-25 MED ORDER — SODIUM CHLORIDE 0.9 % IV SOLN
3.0000 g | Freq: Once | INTRAVENOUS | Status: AC
  Administered 2017-09-25: 3 g via INTRAVENOUS
  Filled 2017-09-25: qty 3

## 2017-09-25 NOTE — Progress Notes (Signed)
ANTIBIOTIC CONSULT NOTE-Preliminary  Pharmacy Consult for unasyn Indication: periorbital cellulitis  Allergies  Allergen Reactions  . Bee Venom Swelling  . Onion Hives  . Mushroom Extract Complex Hives    Patient Measurements: Height: 6' (182.9 cm) Weight: 205 lb 3.2 oz (93.1 kg) IBW/kg (Calculated) : 77.6 Adjusted Body Weight:   Vital Signs: Temp: 98.6 F (37 C) (07/31 2349) Temp Source: Oral (07/31 2349) BP: 175/93 (07/31 2349) Pulse Rate: 66 (07/31 2349)  Labs: Recent Labs    09/24/17 1724  WBC 12.9*  HGB 15.2  PLT 245  CREATININE 1.15    Estimated Creatinine Clearance: 87.2 mL/min (by C-G formula based on SCr of 1.15 mg/dL).  No results for input(s): VANCOTROUGH, VANCOPEAK, VANCORANDOM, GENTTROUGH, GENTPEAK, GENTRANDOM, TOBRATROUGH, TOBRAPEAK, TOBRARND, AMIKACINPEAK, AMIKACINTROU, AMIKACIN in the last 72 hours.   Microbiology: No results found for this or any previous visit (from the past 720 hour(s)).  Medical History: Past Medical History:  Diagnosis Date  . Depression   . Gout   . Hypertension     Medications:  Scheduled:  . busPIRone  5 mg Oral TID  . diphenhydrAMINE  25 mg Oral BID  . enoxaparin (LOVENOX) injection  40 mg Subcutaneous Q24H  . feeding supplement (ENSURE ENLIVE)  237 mL Oral BID BM  . lisinopril  10 mg Oral Daily  . potassium chloride  40 mEq Oral BID   Anti-infectives (From admission, onward)   Start     Dose/Rate Route Frequency Ordered Stop   09/25/17 0300  Ampicillin-Sulbactam (UNASYN) 3 g in sodium chloride 0.9 % 100 mL IVPB     3 g 200 mL/hr over 30 Minutes Intravenous  Once 09/25/17 0026 09/25/17 0326   09/24/17 2300  doxycycline (VIBRAMYCIN) 100 mg in sodium chloride 0.9 % 250 mL IVPB     100 mg 125 mL/hr over 120 Minutes Intravenous Every 12 hours 09/24/17 2234     09/24/17 2030  Ampicillin-Sulbactam (UNASYN) 3 g in sodium chloride 0.9 % 100 mL IVPB     3 g 200 mL/hr over 30 Minutes Intravenous  Once 09/24/17 2028  09/24/17 2119      Assessment: 47 yo male with acute maxillary sinusitis, preseptal cellulitis.  Starting unasyn, doxycycline.    Plan:  Preliminary review of pertinent patient information completed.  Protocol will be initiated with dose(s) of unasyn 3 grams.  Jeani HawkingAnnie Penn clinical pharmacist will complete review during morning rounds to assess patient and finalize treatment regimen if needed.  Loyola MastMidkiff, Tirth Cothron Scarlett, RPH 09/25/2017,3:44 AM

## 2017-09-25 NOTE — Progress Notes (Signed)
Pharmacy Antibiotic Note  Gibson RampBrian Lorensen is a 47 y.o. male admitted on 09/24/2017 with periorbital cellulitis/acute maxillary sinusitis.  Pharmacy has been consulted for Unasyn dosing.  Plan: Unasyn 3gm IV q6h Also on doxycycline 100mg  IV q12h F/U cxs and clinical progress Monitor V/S, labs  Height: 6' (182.9 cm) Weight: 205 lb 3.2 oz (93.1 kg) IBW/kg (Calculated) : 77.6  Temp (24hrs), Avg:98.3 F (36.8 C), Min:98 F (36.7 C), Max:98.6 F (37 C)  Recent Labs  Lab 09/24/17 1724 09/25/17 0358  WBC 12.9* 9.0  CREATININE 1.15 1.08    Estimated Creatinine Clearance: 92.8 mL/min (by C-G formula based on SCr of 1.08 mg/dL).    Allergies  Allergen Reactions  . Bee Venom Swelling  . Onion Hives  . Mushroom Extract Complex Hives    Antimicrobials this admission: Unasyn 7/31 >>  Doxycycline 7/31 >>   Dose adjustments this admission: n/a  Microbiology results: No cxs  Thank you for allowing pharmacy to be a part of this patient's care.  Elder CyphersLorie Dail Meece, BS Pharm D, [place] Clinical Pharmacist Pager [phone] 09/25/2017 9:03 AM

## 2017-09-25 NOTE — Progress Notes (Signed)
Examined the patient's left ear using an otoscope. Ear canal is patent with no sign of exudate or erythema. No evidence for otitis media or otitis externa.  CT maxillofacial with contrast ordered to be done in the am as recommended by Dr Jearld FentonByers, ENT. Please contact him if abnormal.

## 2017-09-25 NOTE — Progress Notes (Signed)
Nutrition Brief Note  Patient is a 47 yo male identified on the Malnutrition Screening Tool (MST) Report. He has actually gained weight. Hyperkalemia, hyperglycemia this morning. His hx is significant for HTN, alcoholism,anxiety and depression. He presented to the ED with left facial swelling, sore throat and left-sided hearing loss.    Wt Readings from Last 15 Encounters:  09/24/17 205 lb 3.2 oz (93.1 kg)  09/23/17 190 lb (86.2 kg)    Body mass index is 27.83 kg/m. Patient meets criteria for overweight based on current BMI.   Current diet order is Heart Healthy, patient is consuming approximately 90% of meals at this time. Patient acute affliction has not dampened his appetite. RD will follow peripherally unless consulted.  Labs and medications reviewed.  BMP Latest Ref Rng & Units 09/25/2017 09/24/2017 09/15/2017  Glucose 70 - 99 mg/dL 161(W161(H) 960(A119(H) 540(J112(H)  BUN 6 - 20 mg/dL 10 13 11   Creatinine 0.61 - 1.24 mg/dL 8.111.08 9.141.15 7.821.08  Sodium 135 - 145 mmol/L 142 141 143  Potassium 3.5 - 5.1 mmol/L 5.2(H) 3.2(L) 4.3  Chloride 98 - 111 mmol/L 108 106 107  CO2 22 - 32 mmol/L 29 24 29   Calcium 8.9 - 10.3 mg/dL 9.3 9.4 9.3     No nutrition interventions warranted at this time. If nutrition issues arise, please consult RD.   Royann ShiversLynn Clarke Amburn MS,RD,CSG,LDN Office: [phone] Pager: [phone]

## 2017-09-25 NOTE — Progress Notes (Addendum)
PROGRESS NOTE  James Fisher ZOX:[id number] DOB: 05/18/1970 DOA: 09/24/2017 PCP: Patient, No Pcp Per  HPI/Recap of past 24 hours: James Fisher is a 47 y.o. male with medical history significant alcoholism, anxiety and depression, HTN who presented to the ED with c/o worsening left facial swelling associated with a sore throat left-sided hearing loss and left eye redness.  Symptoms persisted despite several tablets of Excedrin, Advil and DayQuil..  To the ED on 09/23/2017 due to persistent and worsening pain and swelling of left face.    Temporal bones CT with contrast revealed fluid in the left middle ear cavity and mastoid air cells that may represent otitis media and or mastoid otitis.  Also mild mucosal thickening of the left external auditory canal.  Admitted for suspected periorbital cellulitis and acute maxillary cellulitis.  Started on IV antibiotics.  09/25/2017: Patient seen and examined at his bedside.  He reports persistent left sided facial pain 9 out of 10 refractory to IV pain medications.  ENT consulted to further assess.  Modified IV pain medication/management and added bowel regimen.    Update: No official consult with ENT.  Spoke with Dr. Jearld FentonByers on the phone who recommended continuing IV antibiotics.  Will look into the patient's left ear to rule out otitis externa.  If no improvements after IV antibiotics, ENT recommends to obtain CT maxillofacial with contrast to rule out abscess.    Assessment/Plan: Principal Problem:   Maxillary sinusitis, acute Active Problems:   Anxiety state   Alcoholism (HCC)   Essential hypertension, benign   MDD (major depressive disorder), severe (HCC)   Suspected periorbital cellulitis/acute maxillary cellulitis Persistent-left upper mandible edema with tenderness on palpation Continue IV Unasyn and IV doxycycline CT temporal bones with contrast revealed suspected periorbital cellulitis Discussed with ENT Dr Jearld FentonByers, recommends c/w IV antibiotics- If  no improvement maxillofacial CT with contrast. Will examine left ear to r/o otitis externa which could be very painful. Denies hx of recent swimming. Started oxycodone 10 milligrams twice daily and IV Dilaudid 0.5 mg every 4 hours as needed for severe pain Started Senokot 1 tablet twice daily and MiraLAX daily as bowel regiment to avoid constipation Will have to monitor vital signs closely on round-the-clock pain medications  Accelerated hypertension in the setting of essential hypertension Suspect uncontrolled hypertension is contributed by pain Improved pain management Continue home medications on lisinopril IV hydralazine 10 mg PRN for systolic blood pressure greater than 180 or diastolic blood pressure greater than 105  Chronic anxiety/depression Continue BuSpar Trazodone as needed at night for sleep  GERD Continue Protonix   Code Status: Full code  Family Communication: None at bedside  Disposition Plan: Home in 1 to 2 days when clinically stable and no longer requiring IV pain medications   Consultants:  ENT  Procedures:  None  Antimicrobials:  IV Unasyn and IV Doxycycline  DVT prophylaxis: Subcu Lovenox daily   Objective: Vitals:   09/24/17 2100 09/24/17 2130 09/24/17 2349 09/25/17 0548  BP: (!) 158/92 (!) 159/93 (!) 175/93 139/85  Pulse: 82 70 66 60  Resp: 19 18 19 16   Temp:   98.6 F (37 C) 98.3 F (36.8 C)  TempSrc:   Oral Oral  SpO2: 97% 97%  96%  Weight:   93.1 kg (205 lb 3.2 oz)   Height:   6' (1.829 m)     Intake/Output Summary (Last 24 hours) at 09/25/2017 0938 Last data filed at 09/25/2017 0548 Gross per 24 hour  Intake 500 ml  Output 700 ml  Net -200 ml   Filed Weights   09/24/17 2349  Weight: 93.1 kg (205 lb 3.2 oz)    Exam:  . General: 47 y.o. year-old male well developed well nourished in no acute distress.  Alert and oriented x3.  Left upper mandible swelling with moderate tenderness on palpation.  Dental carry noted in the left  upper tooth but asymptomatic. . Cardiovascular: Regular rate and rhythm with no rubs or gallops.  No thyromegaly or JVD noted.   Marland Kitchen Respiratory: Clear to auscultation with no wheezes or rales. Good inspiratory effort. . Abdomen: Soft nontender nondistended with normal bowel sounds x4 quadrants. . Musculoskeletal: No lower extremity edema. 2/4 pulses in all 4 extremities. . Skin: No ulcerative lesions noted or rashes, . Psychiatry: Mood is appropriate for condition and setting   Data Reviewed: CBC: Recent Labs  Lab 09/24/17 1724 09/25/17 0358  WBC 12.9* 9.0  NEUTROABS 8.5*  --   HGB 15.2 14.3  HCT 43.0 42.2  MCV 90.7 92.3  PLT 245 246   Basic Metabolic Panel: Recent Labs  Lab 09/24/17 1724 09/25/17 0358  NA 141 142  K 3.2* 5.2*  CL 106 108  CO2 24 29  GLUCOSE 119* 161*  BUN 13 10  CREATININE 1.15 1.08  CALCIUM 9.4 9.3  MG 2.2  --    GFR: Estimated Creatinine Clearance: 92.8 mL/min (by C-G formula based on SCr of 1.08 mg/dL). Liver Function Tests: No results for input(s): AST, ALT, ALKPHOS, BILITOT, PROT, ALBUMIN in the last 168 hours. No results for input(s): LIPASE, AMYLASE in the last 168 hours. No results for input(s): AMMONIA in the last 168 hours. Coagulation Profile: No results for input(s): INR, PROTIME in the last 168 hours. Cardiac Enzymes: No results for input(s): CKTOTAL, CKMB, CKMBINDEX, TROPONINI in the last 168 hours. BNP (last 3 results) No results for input(s): PROBNP in the last 8760 hours. HbA1C: No results for input(s): HGBA1C in the last 72 hours. CBG: No results for input(s): GLUCAP in the last 168 hours. Lipid Profile: No results for input(s): CHOL, HDL, LDLCALC, TRIG, CHOLHDL, LDLDIRECT in the last 72 hours. Thyroid Function Tests: No results for input(s): TSH, T4TOTAL, FREET4, T3FREE, THYROIDAB in the last 72 hours. Anemia Panel: No results for input(s): VITAMINB12, FOLATE, FERRITIN, TIBC, IRON, RETICCTPCT in the last 72 hours. Urine  analysis: No results found for: COLORURINE, APPEARANCEUR, LABSPEC, PHURINE, GLUCOSEU, HGBUR, BILIRUBINUR, KETONESUR, PROTEINUR, UROBILINOGEN, NITRITE, LEUKOCYTESUR Sepsis Labs: @LABRCNTIP (procalcitonin:4,lacticidven:4)  )No results found for this or any previous visit (from the past 240 hour(s)).    Studies: Ct Temporal Bones W Contrast  Addendum Date: 09/24/2017   ADDENDUM REPORT: 09/24/2017 22:26 ADDENDUM: Additional evaluation of the orbits was requested. The source data of the temporal bone CT was reformatted for better assessment of the orbits and their contents. Orbits: --Globes: Normal. --Bony orbit: Normal. --Preseptal soft tissues: Minimally asymmetric edema of the left eyelid. --Intra- and extraconal orbital fat: Normal. No inflammatory stranding. --Optic nerves: Normal. --Lacrimal glands and fossae: Normal. --Extraocular muscles: Normal. Visualized sinuses: Bilateral maxillary sinus fluid levels. Minimal opacification of the frontal sinuses. IMPRESSION: No orbital cellulitis. Mild left eyelid edema could indicate mild periorbital cellulitis. These results were called by telephone at the time of interpretation on 09/24/2017 at 10:26 pm to Dr. Mariea Clonts, who verbally acknowledged these results. Electronically Signed   By: Deatra Robinson M.D.   On: 09/24/2017 22:26   Result Date: 09/24/2017 CLINICAL DATA:  Hearing loss, conductive. Seen in the emergency  department for upper respiratory infection yesterday. Increased pressure in head. Now unable to hear from left ear. EXAM: CT TEMPORAL BONES WITH CONTRAST TECHNIQUE: Axial and coronal plane CT imaging of the petrous temporal bones was performed with thin-collimation image reconstruction after intravenous contrast administration. Multiplanar CT image reconstructions were also generated. CONTRAST:  75mL OMNIPAQUE IOHEXOL 300 MG/ML  SOLN COMPARISON:  CT head without contrast 07/26/2014 FINDINGS: Limited imaging of the brain is unremarkable. Focal  enhancement is present. Fluid levels are present in the maxillary sinuses bilaterally. There is scattered mucosal thickening throughout the ethmoid air cells. The right external auditory canal is within limits. Tympanic membrane is visualized and within limits. The right middle ear cavity is clear. Middle ear ossicles are normally formed and articulating. Oval window is patent. Epitympanum is clear. Mastoid air cells are clear. The inner ear structures are normally formed. 2.5 turns are present in the cochlea. The superior and lateral semicircular canals are covered. The internal auditory canal and vestibular aqueduct are normal. There is some mucosal thickening within the external auditory canal. Fluid is present within the left middle ear cavity medial and inferior to the ossicles. Ossicles are intact. Normal articulation is present. The oval window is patent. There is fluid in the epitympanum and mastoid air cells without osseous destruction. Extraosseous soft tissue or enhancement is present. The inner ear structures are normally formed. 2.5 turns are present in the cochlea. The superior and lateral semicircular canals are covered. The internal auditory canal and vestibular aqueduct are normal. IMPRESSION: 1. Fluid in the left middle ear cavity and mastoid air cells may represent otitis media and/or mastoiditis. This may represent a simple effusion. No osseous destruction is present. 2. Mild mucosal thickening of the left external auditory canal supports an inflammatory process. 3. Fluid levels within the maxillary sinuses bilaterally compatible with acute sinusitis. Electronically Signed: By: Marin Roberts M.D. On: 09/24/2017 19:26    Scheduled Meds: . busPIRone  5 mg Oral TID  . diphenhydrAMINE  25 mg Oral BID  . enoxaparin (LOVENOX) injection  40 mg Subcutaneous Q24H  . feeding supplement (ENSURE ENLIVE)  237 mL Oral BID BM  . lisinopril  10 mg Oral Daily  . oxyCODONE  10 mg Oral Q12H  .  polyethylene glycol  17 g Oral Daily  . senna  1 tablet Oral BID    Continuous Infusions: . ampicillin-sulbactam (UNASYN) IV    . doxycycline (VIBRAMYCIN) IV Stopped (09/25/17 0258)     LOS: 1 day     Darlin Drop, MD [facility] Pager [phone]  If 7PM-7AM, please contact night-coverage [email or web address] Password [facility] 09/25/2017, 9:38 AM

## 2017-09-26 ENCOUNTER — Inpatient Hospital Stay (HOSPITAL_COMMUNITY): Payer: Self-pay

## 2017-09-26 DIAGNOSIS — J320 Chronic maxillary sinusitis: Secondary | ICD-10-CM

## 2017-09-26 LAB — COMPREHENSIVE METABOLIC PANEL
ALT: 40 U/L (ref 0–44)
AST: 26 U/L (ref 15–41)
Albumin: 3.4 g/dL — ABNORMAL LOW (ref 3.5–5.0)
Alkaline Phosphatase: 96 U/L (ref 38–126)
Anion gap: 8 (ref 5–15)
BILIRUBIN TOTAL: 0.7 mg/dL (ref 0.3–1.2)
BUN: 12 mg/dL (ref 6–20)
CALCIUM: 8.6 mg/dL — AB (ref 8.9–10.3)
CO2: 28 mmol/L (ref 22–32)
CREATININE: 1.01 mg/dL (ref 0.61–1.24)
Chloride: 106 mmol/L (ref 98–111)
Glucose, Bld: 110 mg/dL — ABNORMAL HIGH (ref 70–99)
Potassium: 4.2 mmol/L (ref 3.5–5.1)
Sodium: 142 mmol/L (ref 135–145)
TOTAL PROTEIN: 6.2 g/dL — AB (ref 6.5–8.1)

## 2017-09-26 LAB — CBC
HEMATOCRIT: 41.3 % (ref 39.0–52.0)
Hemoglobin: 13.9 g/dL (ref 13.0–17.0)
MCH: 31.3 pg (ref 26.0–34.0)
MCHC: 33.7 g/dL (ref 30.0–36.0)
MCV: 93 fL (ref 78.0–100.0)
PLATELETS: 225 10*3/uL (ref 150–400)
RBC: 4.44 MIL/uL (ref 4.22–5.81)
RDW: 13.3 % (ref 11.5–15.5)
WBC: 11.1 10*3/uL — AB (ref 4.0–10.5)

## 2017-09-26 LAB — HIV ANTIBODY (ROUTINE TESTING W REFLEX): HIV Screen 4th Generation wRfx: NONREACTIVE

## 2017-09-26 MED ORDER — GABAPENTIN 300 MG PO CAPS
300.0000 mg | ORAL_CAPSULE | Freq: Three times a day (TID) | ORAL | Status: DC
  Administered 2017-09-26 – 2017-09-29 (×9): 300 mg via ORAL
  Filled 2017-09-26 (×9): qty 1

## 2017-09-26 MED ORDER — MORPHINE SULFATE (PF) 4 MG/ML IV SOLN
4.0000 mg | INTRAVENOUS | Status: DC | PRN
  Administered 2017-09-26 – 2017-09-27 (×6): 4 mg via INTRAVENOUS
  Filled 2017-09-26 (×6): qty 1

## 2017-09-26 MED ORDER — IOPAMIDOL (ISOVUE-300) INJECTION 61%
100.0000 mL | Freq: Once | INTRAVENOUS | Status: AC | PRN
  Administered 2017-09-26: 75 mL via INTRAVENOUS

## 2017-09-26 MED ORDER — MORPHINE SULFATE (PF) 2 MG/ML IV SOLN
2.0000 mg | INTRAVENOUS | Status: DC | PRN
  Administered 2017-09-26: 2 mg via INTRAVENOUS
  Filled 2017-09-26: qty 1

## 2017-09-26 NOTE — Care Management Note (Signed)
Case Management Note  Patient Details  Name: James Fisher MRN: [id number] Date of Birth: 1970/11/05  Subjective/Objective:   Maxillary sinusitis, eye cellulitis.            Action/Plan:  DC tomorrow 8.3.2019., MATCH voucher given to assist with medication cost. Care Connect flyer given to assist with community resources and primary care.   Expected Discharge Date:  09/25/17               Expected Discharge Plan:  Home/Self Care  In-House Referral:     Discharge planning Services  CM Consult, MATCH Program, Other - See comment(Care connect flyer to help with community resources and PCP)  Post Acute Care Choice:  NA Choice offered to:  NA  DME Arranged:    DME Agency:     HH Arranged:    HH Agency:     Status of Service:  Completed, signed off  If discussed at [employer] Length of Stay Meetings, dates discussed:    Additional Comments:  Elby Blackwelder, Chrystine OilerSharley Diane, RN 09/26/2017, 12:43 PM

## 2017-09-26 NOTE — Progress Notes (Signed)
PROGRESS NOTE  James Fisher ZOX:[id number] DOB: 11/06/1970 DOA: 09/24/2017 PCP: Patient, No Pcp Per  Brief History:  47 y.o.malewith medical history significantalcoholism,anxiety and depression, HTNwho presented to the ED with c/oworsening left facial swelling associated with a sore throat left-sided hearing loss and left eye redness.  Symptoms persisted despite several tablets of Excedrin, Advil and DayQuil..  To the ED on 09/23/2017 due to persistent and worsening pain and swelling of left face.  Temporal bones CT with contrast revealed fluid in the left middle ear cavity that may represent otitis media.  Mastoid air cells were clear.  Also mild mucosal thickening of the left external auditory canal.  Admitted for suspected periorbital cellulitis and acute maxillary cellulitis.  Started on IV antibiotics.  Due to continued pain, CT maxillofacial was obtained which showed improvement in maxillary sinus fluid and left middle ear fluid   Assessment/Plan: Periorbital cellulitis -edema and erythema improved -CT temporal bones with contrast revealed suspected periorbital cellulitis -Dr. Margo Aye spoke with ENT, Dr. Debria Garret c/w IV antibiotics- If no improvement maxillofacial CT with contrast -CT maxillofacial-->improving sinus pattern -continue oxycontin -pt request change to morphine 4 mg  -Senokot 1 tablet twice daily and MiraLAX daily as bowel regiment to avoid constipation -clinically, pain appears out of proportion with objective findings -add gabapentin  Accelerated hypertension in the setting of essential hypertension -Suspect uncontrolled hypertension is contributed by pain -Improved pain management -Continue home medications on lisinopril -IV hydralazine 10 mg PRN for systolic blood pressure greater than 180 or diastolic blood pressure greater than 105  Chronic anxiety/depression -Continue BuSpar -Trazodone as needed at night for sleep -no  SI  GERD -Continue Protonix    Disposition Plan:   Home 8/3 if stable Family Communication:  No Family at bedside--Total time spent 35 minutes.  Greater than 50% spent face to face counseling and coordinating care.   Consultants:  ENT--Byers  Code Status:  FULL   DVT Prophylaxis:  Keeler Lovenox   Procedures: As Listed in Progress Note Above  Antibiotics: unasyn 7/31>>> Doxy 8/1>>    Subjective: Pt complains of 9/10 pain to feather touch of left face and scalp.  C/o coughing up yellow sputum.  Denies cp, sob, n/v/d.  No dysphagia or odynophagia.  C/o left face pressure and pain  Objective: Vitals:   09/25/17 0548 09/25/17 1433 09/25/17 2139 09/26/17 0559  BP: 139/85 (!) 143/84 (!) 145/96 132/88  Pulse: 60 77 66 68  Resp: 16 18 18 16   Temp: 98.3 F (36.8 C) 98.3 F (36.8 C) 97.6 F (36.4 C) 97.9 F (36.6 C)  TempSrc: Oral Oral Oral Oral  SpO2: 96% 99% 98% 98%  Weight:      Height:        Intake/Output Summary (Last 24 hours) at 09/26/2017 1222 Last data filed at 09/26/2017 0315 Gross per 24 hour  Intake 1572.86 ml  Output -  Net 1572.86 ml   Weight change:  Exam:   General:  Pt is alert, follows commands appropriately, not in acute distress  HEENT: No icterus, No thrush, No neck mass, [place]/AT;  Left EAC--no vesicles or lesions, no erythema; left TM--no buldging, no perforation, mild erythema with small amount of fluid;  R-EAC--clear with clear R TM;  Shotty anterior cervical LN  Cardiovascular: RRR, S1/S2, no rubs, no gallops  Respiratory: CTA bilaterally, no wheezing, no crackles, no rhonchi  Abdomen: Soft/+BS, non tender, non distended, no guarding  Extremities: No edema, No lymphangitis,  No petechiae, No rashes, no synovitis   Data Reviewed: I have personally reviewed following labs and imaging studies Basic Metabolic Panel: Recent Labs  Lab 09/24/17 1724 09/25/17 0358 09/26/17 0517  NA 141 142 142  K 3.2* 5.2* 4.2  CL 106 108 106  CO2 24 29  28   GLUCOSE 119* 161* 110*  BUN 13 10 12   CREATININE 1.15 1.08 1.01  CALCIUM 9.4 9.3 8.6*  MG 2.2  --   --    Liver Function Tests: Recent Labs  Lab 09/26/17 0517  AST 26  ALT 40  ALKPHOS 96  BILITOT 0.7  PROT 6.2*  ALBUMIN 3.4*   No results for input(s): LIPASE, AMYLASE in the last 168 hours. No results for input(s): AMMONIA in the last 168 hours. Coagulation Profile: No results for input(s): INR, PROTIME in the last 168 hours. CBC: Recent Labs  Lab 09/24/17 1724 09/25/17 0358 09/26/17 0517  WBC 12.9* 9.0 11.1*  NEUTROABS 8.5*  --   --   HGB 15.2 14.3 13.9  HCT 43.0 42.2 41.3  MCV 90.7 92.3 93.0  PLT 245 246 225   Cardiac Enzymes: No results for input(s): CKTOTAL, CKMB, CKMBINDEX, TROPONINI in the last 168 hours. BNP: Invalid input(s): POCBNP CBG: No results for input(s): GLUCAP in the last 168 hours. HbA1C: No results for input(s): HGBA1C in the last 72 hours. Urine analysis: No results found for: COLORURINE, APPEARANCEUR, LABSPEC, PHURINE, GLUCOSEU, HGBUR, BILIRUBINUR, KETONESUR, PROTEINUR, UROBILINOGEN, NITRITE, LEUKOCYTESUR Sepsis Labs: @LABRCNTIP (procalcitonin:4,lacticidven:4) )No results found for this or any previous visit (from the past 240 hour(s)).   Scheduled Meds: . busPIRone  5 mg Oral TID  . diphenhydrAMINE  25 mg Oral BID  . enoxaparin (LOVENOX) injection  40 mg Subcutaneous Q24H  . feeding supplement (ENSURE ENLIVE)  237 mL Oral BID BM  . gabapentin  300 mg Oral TID  . lisinopril  10 mg Oral Daily  . oxyCODONE  10 mg Oral Q12H  . polyethylene glycol  17 g Oral Daily  . senna  1 tablet Oral BID   Continuous Infusions: . ampicillin-sulbactam (UNASYN) IV 3 g (09/26/17 1039)  . doxycycline (VIBRAMYCIN) IV Stopped (09/26/17 0153)    Procedures/Studies: Ct Maxillofacial W Contrast  Result Date: 09/26/2017 CLINICAL DATA:  Sore throat and productive cough. Left-sided headache. EXAM: CT MAXILLOFACIAL WITH CONTRAST TECHNIQUE: Multidetector CT  imaging of the maxillofacial structures was performed with intravenous contrast. Multiplanar CT image reconstructions were also generated. CONTRAST:  75mL ISOVUE-300 IOPAMIDOL (ISOVUE-300) INJECTION 61% COMPARISON:  09/24/2017 FINDINGS: Osseous: No abnormal osseous finding. There is a small amount of fluid in the middle ear cavity on the left as seen on the previous study suggesting possible otitis media. This is somewhat improved. Orbits: Normal appearance of the orbits. No abnormality of the globes, optic nerves, extraocular muscles, orbital fat or lacrimal glands. Sinuses: Mild mucosal inflammation of the paranasal sinuses with small air-fluid levels in the maxillary sinuses. Maxillary sinus fluid is improved since the prior exam. Soft tissues: No other abnormality of the soft tissue face in the region imaged. This does include the oropharynx and tonsillar regions which appear unremarkable. Limited intracranial: Normal IMPRESSION: Sinusitis pattern, improved since the study of 3 days ago. Fluid levels in the maxillary sinuses are smaller. Small amount of fluid in the middle ear cavity on the left as seen previously suggesting possible otitis media, also improved. Otherwise negative exam. Electronically Signed   By: Paulina FusiMark  Shogry M.D.   On: 09/26/2017 07:12   Dg Foot  2 Views Right  Result Date: 09/13/2017 CLINICAL DATA:  Right foot pain/swelling/redness since yesterday morning. Hx gout EXAM: RIGHT FOOT - 2 VIEW COMPARISON:  None. FINDINGS: There is no evidence of fracture or dislocation. There is no evidence of arthropathy or other focal bone abnormality. Soft tissues are unremarkable. IMPRESSION: Negative. Electronically Signed   By: Corlis Leak M.D.   On: 09/13/2017 14:18   Ct Temporal Bones W Contrast  Addendum Date: 09/24/2017   ADDENDUM REPORT: 09/24/2017 22:26 ADDENDUM: Additional evaluation of the orbits was requested. The source data of the temporal bone CT was reformatted for better assessment of  the orbits and their contents. Orbits: --Globes: Normal. --Bony orbit: Normal. --Preseptal soft tissues: Minimally asymmetric edema of the left eyelid. --Intra- and extraconal orbital fat: Normal. No inflammatory stranding. --Optic nerves: Normal. --Lacrimal glands and fossae: Normal. --Extraocular muscles: Normal. Visualized sinuses: Bilateral maxillary sinus fluid levels. Minimal opacification of the frontal sinuses. IMPRESSION: No orbital cellulitis. Mild left eyelid edema could indicate mild periorbital cellulitis. These results were called by telephone at the time of interpretation on 09/24/2017 at 10:26 pm to Dr. Mariea Clonts, who verbally acknowledged these results. Electronically Signed   By: Deatra Robinson M.D.   On: 09/24/2017 22:26   Result Date: 09/24/2017 CLINICAL DATA:  Hearing loss, conductive. Seen in the emergency department for upper respiratory infection yesterday. Increased pressure in head. Now unable to hear from left ear. EXAM: CT TEMPORAL BONES WITH CONTRAST TECHNIQUE: Axial and coronal plane CT imaging of the petrous temporal bones was performed with thin-collimation image reconstruction after intravenous contrast administration. Multiplanar CT image reconstructions were also generated. CONTRAST:  75mL OMNIPAQUE IOHEXOL 300 MG/ML  SOLN COMPARISON:  CT head without contrast 07/26/2014 FINDINGS: Limited imaging of the brain is unremarkable. Focal enhancement is present. Fluid levels are present in the maxillary sinuses bilaterally. There is scattered mucosal thickening throughout the ethmoid air cells. The right external auditory canal is within limits. Tympanic membrane is visualized and within limits. The right middle ear cavity is clear. Middle ear ossicles are normally formed and articulating. Oval window is patent. Epitympanum is clear. Mastoid air cells are clear. The inner ear structures are normally formed. 2.5 turns are present in the cochlea. The superior and lateral semicircular canals  are covered. The internal auditory canal and vestibular aqueduct are normal. There is some mucosal thickening within the external auditory canal. Fluid is present within the left middle ear cavity medial and inferior to the ossicles. Ossicles are intact. Normal articulation is present. The oval window is patent. There is fluid in the epitympanum and mastoid air cells without osseous destruction. Extraosseous soft tissue or enhancement is present. The inner ear structures are normally formed. 2.5 turns are present in the cochlea. The superior and lateral semicircular canals are covered. The internal auditory canal and vestibular aqueduct are normal. IMPRESSION: 1. Fluid in the left middle ear cavity and mastoid air cells may represent otitis media and/or mastoiditis. This may represent a simple effusion. No osseous destruction is present. 2. Mild mucosal thickening of the left external auditory canal supports an inflammatory process. 3. Fluid levels within the maxillary sinuses bilaterally compatible with acute sinusitis. Electronically Signed: By: Marin Roberts M.D. On: 09/24/2017 19:26    Catarina Hartshorn, DO  [facility] Pager [phone]  If 7PM-7AM, please contact night-coverage [email or web address] Password [email or web address] 09/26/2017, 12:22 PM   LOS: 2 days

## 2017-09-27 ENCOUNTER — Inpatient Hospital Stay (HOSPITAL_COMMUNITY): Payer: Self-pay

## 2017-09-27 DIAGNOSIS — R51 Headache: Secondary | ICD-10-CM

## 2017-09-27 DIAGNOSIS — R519 Headache, unspecified: Secondary | ICD-10-CM

## 2017-09-27 LAB — CBC
HCT: 41.8 % (ref 39.0–52.0)
Hemoglobin: 14 g/dL (ref 13.0–17.0)
MCH: 31 pg (ref 26.0–34.0)
MCHC: 33.5 g/dL (ref 30.0–36.0)
MCV: 92.7 fL (ref 78.0–100.0)
PLATELETS: 255 10*3/uL (ref 150–400)
RBC: 4.51 MIL/uL (ref 4.22–5.81)
RDW: 13 % (ref 11.5–15.5)
WBC: 11.6 10*3/uL — AB (ref 4.0–10.5)

## 2017-09-27 LAB — URINALYSIS, COMPLETE (UACMP) WITH MICROSCOPIC
Bacteria, UA: NONE SEEN
Bilirubin Urine: NEGATIVE
Glucose, UA: NEGATIVE mg/dL
HGB URINE DIPSTICK: NEGATIVE
KETONES UR: NEGATIVE mg/dL
LEUKOCYTES UA: NEGATIVE
NITRITE: NEGATIVE
Protein, ur: NEGATIVE mg/dL
Specific Gravity, Urine: 1.011 (ref 1.005–1.030)
pH: 7 (ref 5.0–8.0)

## 2017-09-27 MED ORDER — MORPHINE SULFATE (PF) 2 MG/ML IV SOLN
2.0000 mg | INTRAVENOUS | Status: DC | PRN
  Administered 2017-09-28: 2 mg via INTRAVENOUS
  Filled 2017-09-27: qty 1

## 2017-09-27 MED ORDER — OXYMETAZOLINE HCL 0.05 % NA SOLN
1.0000 | Freq: Two times a day (BID) | NASAL | Status: DC
  Administered 2017-09-28 – 2017-09-29 (×3): 1 via NASAL
  Filled 2017-09-27 (×2): qty 15

## 2017-09-27 MED ORDER — VANCOMYCIN HCL 10 G IV SOLR
2000.0000 mg | Freq: Once | INTRAVENOUS | Status: AC
  Administered 2017-09-27: 2000 mg via INTRAVENOUS
  Filled 2017-09-27 (×2): qty 2000

## 2017-09-27 MED ORDER — VANCOMYCIN HCL IN DEXTROSE 1-5 GM/200ML-% IV SOLN
1000.0000 mg | Freq: Three times a day (TID) | INTRAVENOUS | Status: DC
  Administered 2017-09-28 – 2017-09-29 (×4): 1000 mg via INTRAVENOUS
  Filled 2017-09-27 (×4): qty 200

## 2017-09-27 MED ORDER — PROCHLORPERAZINE EDISYLATE 10 MG/2ML IJ SOLN
5.0000 mg | Freq: Once | INTRAMUSCULAR | Status: AC
  Administered 2017-09-27: 5 mg via INTRAVENOUS
  Filled 2017-09-27: qty 2

## 2017-09-27 MED ORDER — DIPHENHYDRAMINE HCL 50 MG/ML IJ SOLN
12.5000 mg | Freq: Once | INTRAMUSCULAR | Status: AC
  Administered 2017-09-27: 12.5 mg via INTRAVENOUS
  Filled 2017-09-27: qty 1

## 2017-09-27 NOTE — Progress Notes (Signed)
Pharmacy Antibiotic Note  James RampBrian Fisher is a 47 y.o. male admitted on 09/24/2017 with mastoiditis/left periorbital cellulitis/acute maxillary sinusitis.  Pharmacy has been consulted for Vancomycin and Unasyn dosing.  Plan: Vancomycin 2000 mg IV x 1 dose Vancomycin 1000 mg IV every 8 hours Unasyn 3gm IV q6h F/U cxs and clinical progress Monitor V/S, labs, and vanco trough as indicated  Height: 6' (182.9 cm) Weight: 205 lb 3.2 oz (93.1 kg) IBW/kg (Calculated) : 77.6  Temp (24hrs), Avg:97.9 F (36.6 C), Min:97.4 F (36.3 C), Max:98.2 F (36.8 C)  Recent Labs  Lab 09/24/17 1724 09/25/17 0358 09/26/17 0517 09/27/17 0620  WBC 12.9* 9.0 11.1* 11.6*  CREATININE 1.15 1.08 1.01  --     Estimated Creatinine Clearance: 99.2 mL/min (by C-G formula based on SCr of 1.01 mg/dL).    Allergies  Allergen Reactions  . Bee Venom Swelling  . Onion Hives  . Mushroom Extract Complex Hives    Antimicrobials this admission: Vanco 8/3 >> Unasyn 7/31 >>  Doxycycline 7/31 >> 8/3  Dose adjustments this admission: n/a  Microbiology results: 8/3 Urine cx: pending  Thank you for allowing pharmacy to be a part of this patient's care.  Judeth CornfieldSteven Kimball Manske, PharmD Clinical Pharmacist 09/27/2017 7:47 PM

## 2017-09-27 NOTE — Progress Notes (Signed)
PROGRESS NOTE  Gibson RampBrian Hammad WGN:[id number] DOB: 1970-11-03 DOA: 09/24/2017 PCP: Patient, No Pcp Per  Brief History:  47 y.o.malewith medical history significantalcoholism,anxiety and depression, HTNwho presented to the ED with c/oworseningleft facial swelling associated with a sore throat left-sided hearing loss and left eye redness. Symptoms persisted despite several tablets of Excedrin, Advil and DayQuil.. To the ED on 09/23/2017 due to persistent and worsening pain and swelling of left face.Temporal bones CT with contrast revealed fluid in the left middle ear cavity that may represent otitis media.  Mastoid air cells were clear. Also mild mucosal thickening of the left external auditory canal. Admitted for suspected periorbital cellulitis and acute maxillary cellulitis. Startedon IV antibiotics.  Due to continued pain, CT maxillofacial was obtained which showed improvement in maxillary sinus fluid and left middle ear fluid   Assessment/Plan: Periorbital cellulitis -edema and erythema improved -CT temporal bones with contrast revealed suspected periorbital cellulitis -Dr. Margo AyeHall spoke with ENT, Dr. Debria GarretByers--recommends c/w IV antibiotics- If no improvement maxillofacial CT with contrast -09/26/17-CT maxillofacial-->improving sinus pattern -continue oxycontin -pt request change to morphine 4 mg  -Senokot 1 tablet twice daily and MiraLAX daily as bowel regiment to avoid constipation -clinically, pain appears out of proportion with objective findings -added gabapentin  Nausea and vomiting -likely due to opioids -CT brain with complaints of new headache -decrease morphine to 2 mg IV q4 prn -downgrade to clears  Accelerated hypertension in the setting of essential hypertension -Suspect uncontrolled hypertension is contributed by pain -Improved with pain management -Continue home medications on lisinopril -IV hydralazine 10 mg PRN for systolic blood pressure greater  than 180 or diastolic blood pressure greater than 105  Chronic anxiety/depression -Continue BuSpar -Trazodone as needed at night for sleep -no SI  GERD -Continue Protonix    Disposition Plan:   Home 8/4 if stable Family Communication:  No Family at bedside   Consultants:  ENT--[facility]ers  Code Status:  FULL   DVT Prophylaxis:  [place] Lovenox   Procedures: As Listed in Progress Note Above  Antibiotics: unasyn 7/31>>> Doxy 8/1>>     Subjective: Pt complains of new frontal headache with 2 episodes of emesis-yellow clear fluid.  No f/c.  No cp, sob, focal extremity weakness.  No dysuria.  No diarrhea or abd pain.  Objective: Vitals:   09/26/17 0559 09/26/17 1329 09/26/17 2148 09/27/17 0530  BP: 132/88 133/87 (!) 144/96 135/89  Pulse: 68 72 81 75  Resp: 16  16 16   Temp: 97.9 F (36.6 C) 98.5 F (36.9 C) (!) 97.4 F (36.3 C) 98.1 F (36.7 C)  TempSrc: Oral Oral Oral Oral  SpO2: 98% 96% 98% 98%  Weight:      Height:        Intake/Output Summary (Last 24 hours) at 09/27/2017 1014 Last data filed at 09/26/2017 2136 Gross per 24 hour  Intake 978.61 ml  Output -  Net 978.61 ml   Weight change:  Exam:   General:  Pt is alert, follows commands appropriately, not in acute distress  HEENT: No icterus, No thrush, No neck mass, [place]/AT  Cardiovascular: RRR, S1/S2, no rubs, no gallops  Respiratory: CTA bilaterally, no wheezing, no crackles, no rhonchi  Abdomen: Soft/+BS, non tender, non distended, no guarding  Extremities: No edema, No lymphangitis, No petechiae, No rashes, no synovitis   Data Reviewed: I have personally reviewed following labs and imaging studies Basic Metabolic Panel: Recent Labs  Lab 09/24/17 1724 09/25/17 0358 09/26/17 [id number]  NA 141 142 142  K 3.2* 5.2* 4.2  CL 106 108 106  CO2 24 29 28   GLUCOSE 119* 161* 110*  BUN 13 10 12   CREATININE 1.15 1.08 1.01  CALCIUM 9.4 9.3 8.6*  MG 2.2  --   --    Liver Function Tests: Recent  Labs  Lab 09/26/17 0517  AST 26  ALT 40  ALKPHOS 96  BILITOT 0.7  PROT 6.2*  ALBUMIN 3.4*   No results for input(s): LIPASE, AMYLASE in the last 168 hours. No results for input(s): AMMONIA in the last 168 hours. Coagulation Profile: No results for input(s): INR, PROTIME in the last 168 hours. CBC: Recent Labs  Lab 09/24/17 1724 09/25/17 0358 09/26/17 0517 09/27/17 0620  WBC 12.9* 9.0 11.1* 11.6*  NEUTROABS 8.5*  --   --   --   HGB 15.2 14.3 13.9 14.0  HCT 43.0 42.2 41.3 41.8  MCV 90.7 92.3 93.0 92.7  PLT 245 246 225 255   Cardiac Enzymes: No results for input(s): CKTOTAL, CKMB, CKMBINDEX, TROPONINI in the last 168 hours. BNP: Invalid input(s): POCBNP CBG: No results for input(s): GLUCAP in the last 168 hours. HbA1C: No results for input(s): HGBA1C in the last 72 hours. Urine analysis: No results found for: COLORURINE, APPEARANCEUR, LABSPEC, PHURINE, GLUCOSEU, HGBUR, BILIRUBINUR, KETONESUR, PROTEINUR, UROBILINOGEN, NITRITE, LEUKOCYTESUR Sepsis Labs: @LABRCNTIP (procalcitonin:4,lacticidven:4) )No results found for this or any previous visit (from the past 240 hour(s)).   Scheduled Meds: . busPIRone  5 mg Oral TID  . diphenhydrAMINE  25 mg Oral BID  . diphenhydrAMINE  12.5 mg Intravenous Once  . enoxaparin (LOVENOX) injection  40 mg Subcutaneous Q24H  . feeding supplement (ENSURE ENLIVE)  237 mL Oral BID BM  . gabapentin  300 mg Oral TID  . lisinopril  10 mg Oral Daily  . oxyCODONE  10 mg Oral Q12H  . polyethylene glycol  17 g Oral Daily  . prochlorperazine  5 mg Intravenous Once  . senna  1 tablet Oral BID   Continuous Infusions: . ampicillin-sulbactam (UNASYN) IV Stopped (09/27/17 0530)  . doxycycline (VIBRAMYCIN) IV Stopped (09/27/17 0030)    Procedures/Studies: Ct Maxillofacial W Contrast  Result Date: 09/26/2017 CLINICAL DATA:  Sore throat and productive cough. Left-sided headache. EXAM: CT MAXILLOFACIAL WITH CONTRAST TECHNIQUE: Multidetector CT imaging  of the maxillofacial structures was performed with intravenous contrast. Multiplanar CT image reconstructions were also generated. CONTRAST:  75mL ISOVUE-300 IOPAMIDOL (ISOVUE-300) INJECTION 61% COMPARISON:  09/24/2017 FINDINGS: Osseous: No abnormal osseous finding. There is a small amount of fluid in the middle ear cavity on the left as seen on the previous study suggesting possible otitis media. This is somewhat improved. Orbits: Normal appearance of the orbits. No abnormality of the globes, optic nerves, extraocular muscles, orbital fat or lacrimal glands. Sinuses: Mild mucosal inflammation of the paranasal sinuses with small air-fluid levels in the maxillary sinuses. Maxillary sinus fluid is improved since the prior exam. Soft tissues: No other abnormality of the soft tissue face in the region imaged. This does include the oropharynx and tonsillar regions which appear unremarkable. Limited intracranial: Normal IMPRESSION: Sinusitis pattern, improved since the study of 3 days ago. Fluid levels in the maxillary sinuses are smaller. Small amount of fluid in the middle ear cavity on the left as seen previously suggesting possible otitis media, also improved. Otherwise negative exam. Electronically Signed   By: Paulina Fusi M.D.   On: 09/26/2017 07:12   Dg Foot 2 Views Right  Result Date: 09/13/2017 CLINICAL  DATA:  Right foot pain/swelling/redness since yesterday morning. Hx gout EXAM: RIGHT FOOT - 2 VIEW COMPARISON:  None. FINDINGS: There is no evidence of fracture or dislocation. There is no evidence of arthropathy or other focal bone abnormality. Soft tissues are unremarkable. IMPRESSION: Negative. Electronically Signed   By: Corlis Leak M.D.   On: 09/13/2017 14:18   Ct Temporal Bones W Contrast  Addendum Date: 09/24/2017   ADDENDUM REPORT: 09/24/2017 22:26 ADDENDUM: Additional evaluation of the orbits was requested. The source data of the temporal bone CT was reformatted for better assessment of the orbits  and their contents. Orbits: --Globes: Normal. --Bony orbit: Normal. --Preseptal soft tissues: Minimally asymmetric edema of the left eyelid. --Intra- and extraconal orbital fat: Normal. No inflammatory stranding. --Optic nerves: Normal. --Lacrimal glands and fossae: Normal. --Extraocular muscles: Normal. Visualized sinuses: Bilateral maxillary sinus fluid levels. Minimal opacification of the frontal sinuses. IMPRESSION: No orbital cellulitis. Mild left eyelid edema could indicate mild periorbital cellulitis. These results were called by telephone at the time of interpretation on 09/24/2017 at 10:26 pm to Dr. Mariea Clonts, who verbally acknowledged these results. Electronically Signed   By: Deatra Robinson M.D.   On: 09/24/2017 22:26   Result Date: 09/24/2017 CLINICAL DATA:  Hearing loss, conductive. Seen in the emergency department for upper respiratory infection yesterday. Increased pressure in head. Now unable to hear from left ear. EXAM: CT TEMPORAL BONES WITH CONTRAST TECHNIQUE: Axial and coronal plane CT imaging of the petrous temporal bones was performed with thin-collimation image reconstruction after intravenous contrast administration. Multiplanar CT image reconstructions were also generated. CONTRAST:  75mL OMNIPAQUE IOHEXOL 300 MG/ML  SOLN COMPARISON:  CT head without contrast 07/26/2014 FINDINGS: Limited imaging of the brain is unremarkable. Focal enhancement is present. Fluid levels are present in the maxillary sinuses bilaterally. There is scattered mucosal thickening throughout the ethmoid air cells. The right external auditory canal is within limits. Tympanic membrane is visualized and within limits. The right middle ear cavity is clear. Middle ear ossicles are normally formed and articulating. Oval window is patent. Epitympanum is clear. Mastoid air cells are clear. The inner ear structures are normally formed. 2.5 turns are present in the cochlea. The superior and lateral semicircular canals are covered.  The internal auditory canal and vestibular aqueduct are normal. There is some mucosal thickening within the external auditory canal. Fluid is present within the left middle ear cavity medial and inferior to the ossicles. Ossicles are intact. Normal articulation is present. The oval window is patent. There is fluid in the epitympanum and mastoid air cells without osseous destruction. Extraosseous soft tissue or enhancement is present. The inner ear structures are normally formed. 2.5 turns are present in the cochlea. The superior and lateral semicircular canals are covered. The internal auditory canal and vestibular aqueduct are normal. IMPRESSION: 1. Fluid in the left middle ear cavity and mastoid air cells may represent otitis media and/or mastoiditis. This may represent a simple effusion. No osseous destruction is present. 2. Mild mucosal thickening of the left external auditory canal supports an inflammatory process. 3. Fluid levels within the maxillary sinuses bilaterally compatible with acute sinusitis. Electronically Signed: By: Marin Roberts M.D. On: 09/24/2017 19:26    Catarina Hartshorn, DO  [facility] Pager [phone]  If 7PM-7AM, please contact night-coverage [email or web address] Password [email or web address] 09/27/2017, 10:14 AM   LOS: 3 days

## 2017-09-27 NOTE — Progress Notes (Signed)
Case discussed with ENT, Dr. Lazarus SalinesWolicki -scans not impressive -continue non-operative management -add afrin  -d/c doxy -add vanco -advance diet now that n/v better  DTat

## 2017-09-28 DIAGNOSIS — H70009 Acute mastoiditis without complications, unspecified ear: Secondary | ICD-10-CM

## 2017-09-28 DIAGNOSIS — H70002 Acute mastoiditis without complications, left ear: Secondary | ICD-10-CM

## 2017-09-28 NOTE — Progress Notes (Signed)
PROGRESS NOTE  Judas Mohammad ZOX:[id number] DOB: February 25, 1971 DOA: 09/24/2017 PCP: Patient, No Pcp Per Brief History: 47 y.o.malewith medical history significantalcoholism,anxiety and depression, HTNwho presented to the ED with c/oworseningleft facial swelling associated with a sore throat left-sided hearing loss and left eye redness. Symptoms persisted despite several tablets of Excedrin, Advil and DayQuil.. To the ED on 09/23/2017 due to persistent and worsening pain and swelling of left face.Temporal bones CT with contrast revealed fluid in the left middle ear cavity that may represent otitis media. Mastoid air cells were clear.Also mild mucosal thickening of the left external auditory canal. Admitted for suspected periorbital cellulitis and acute maxillary cellulitis. Startedon IV antibiotics.Due to continued pain, CT maxillofacial was obtained which showed improvement in maxillary sinus fluid and left middle ear fluid   Assessment/Plan: Periorbital cellulitis/acute sinusitis/Acute mastoiditis -edema and erythema improved -CT temporal bones with contrast revealed suspected periorbital cellulitis -Dr. Margo Aye spoke with ENT, Dr. Debria Garret c/w IV antibiotics- If no improvement maxillofacial CT with contrast -09/26/17-CT maxillofacial-->improving sinus pattern -continue oxycontin -pt request change to morphine 4 mg  -Senokot 1 tablet twice daily and MiraLAX daily as bowel regiment to avoid constipation -clinically, pain appears out of proportion with objective findings -added gabapentin -09/27/17--Case discussed with ENT, Dr. Brendolyn Patty not impressive; continue non-operative management; add afrin -started vancomycin  Nausea and vomiting -likely due to opioids -CT brain with complaints of new headache -decrease morphine to 2 mg IV q4 prn -improved -advance diet  Accelerated hypertension in the setting of essential hypertension -Suspect uncontrolled  hypertension is contributed by pain -Improved with pain management -Continue home medications on lisinopril -IV hydralazine 10 mg PRN for systolic blood pressure greater than 180 or diastolic blood pressure greater than 105  Chronic anxiety/depression -Continue BuSpar -Trazodone as needed at night for sleep -no SI  GERD -Continue Protonix    Disposition Plan: Home 8/5 if stable Family Communication:NoFamily at bedside   Consultants:ENT--[facility]ers and Wolicki  Code Status: FULL   DVT Prophylaxis: [place] Lovenox   Procedures: As Listed in Progress Note Above  Antibiotics: unasyn 7/31>>> Doxy 8/1>>       Subjective: Pt states facial pain and scalp pain is improving.  Pain now localized in left ear and posterior auricular area.  Denies visual disturbance, cp ,sob, n/v/d, f/c  Objective: Vitals:   09/27/17 1341 09/27/17 2107 09/28/17 0534 09/28/17 1338  BP: (!) 134/91 132/82 94/71 123/76  Pulse: 71 85 65 76  Resp: 20 18 18 18   Temp: 98.2 F (36.8 C) 98.6 F (37 C) 98.2 F (36.8 C) 99.4 F (37.4 C)  TempSrc: Oral Oral Oral Oral  SpO2: 98% 99% 94% 97%  Weight:      Height:        Intake/Output Summary (Last 24 hours) at 09/28/2017 1707 Last data filed at 09/28/2017 1535 Gross per 24 hour  Intake 3100 ml  Output -  Net 3100 ml   Weight change:  Exam:   General:  Pt is alert, follows commands appropriately, not in acute distress  HEENT: No icterus, No thrush, No neck mass, [place]/AT  Cardiovascular: RRR, S1/S2, no rubs, no gallops  Respiratory: CTA bilaterally, no wheezing, no crackles, no rhonchi  Abdomen: Soft/+BS, non tender, non distended, no guarding  Extremities: No edema, No lymphangitis, No petechiae, No rashes, no synovitis   Data Reviewed: I have personally reviewed following labs and imaging studies Basic Metabolic Panel: Recent Labs  Lab 09/24/17 1724 09/25/17 0358 09/26/17  0517  NA 141 142 142  K 3.2* 5.2* 4.2  CL  106 108 106  CO2 24 29 28   GLUCOSE 119* 161* 110*  BUN 13 10 12   CREATININE 1.15 1.08 1.01  CALCIUM 9.4 9.3 8.6*  MG 2.2  --   --    Liver Function Tests: Recent Labs  Lab 09/26/17 0517  AST 26  ALT 40  ALKPHOS 96  BILITOT 0.7  PROT 6.2*  ALBUMIN 3.4*   No results for input(s): LIPASE, AMYLASE in the last 168 hours. No results for input(s): AMMONIA in the last 168 hours. Coagulation Profile: No results for input(s): INR, PROTIME in the last 168 hours. CBC: Recent Labs  Lab 09/24/17 1724 09/25/17 0358 09/26/17 0517 09/27/17 0620  WBC 12.9* 9.0 11.1* 11.6*  NEUTROABS 8.5*  --   --   --   HGB 15.2 14.3 13.9 14.0  HCT 43.0 42.2 41.3 41.8  MCV 90.7 92.3 93.0 92.7  PLT 245 246 225 255   Cardiac Enzymes: No results for input(s): CKTOTAL, CKMB, CKMBINDEX, TROPONINI in the last 168 hours. BNP: Invalid input(s): POCBNP CBG: No results for input(s): GLUCAP in the last 168 hours. HbA1C: No results for input(s): HGBA1C in the last 72 hours. Urine analysis:    Component Value Date/Time   COLORURINE YELLOW 09/27/2017 1500   APPEARANCEUR CLEAR 09/27/2017 1500   LABSPEC 1.011 09/27/2017 1500   PHURINE 7.0 09/27/2017 1500   GLUCOSEU NEGATIVE 09/27/2017 1500   HGBUR NEGATIVE 09/27/2017 1500   BILIRUBINUR NEGATIVE 09/27/2017 1500   KETONESUR NEGATIVE 09/27/2017 1500   PROTEINUR NEGATIVE 09/27/2017 1500   NITRITE NEGATIVE 09/27/2017 1500   LEUKOCYTESUR NEGATIVE 09/27/2017 1500   Sepsis Labs: @LABRCNTIP (procalcitonin:4,lacticidven:4) )No results found for this or any previous visit (from the past 240 hour(s)).   Scheduled Meds: . busPIRone  5 mg Oral TID  . diphenhydrAMINE  25 mg Oral BID  . enoxaparin (LOVENOX) injection  40 mg Subcutaneous Q24H  . feeding supplement (ENSURE ENLIVE)  237 mL Oral BID BM  . gabapentin  300 mg Oral TID  . lisinopril  10 mg Oral Daily  . oxyCODONE  10 mg Oral Q12H  . oxymetazoline  1 spray Each Nare BID  . polyethylene glycol  17 g  Oral Daily  . senna  1 tablet Oral BID   Continuous Infusions: . ampicillin-sulbactam (UNASYN) IV Stopped (09/28/17 1400)  . vancomycin Stopped (09/28/17 1535)    Procedures/Studies: Ct Head Wo Contrast  Result Date: 09/27/2017 CLINICAL DATA:  Left-sided headache, worse this morning. EXAM: CT HEAD WITHOUT CONTRAST TECHNIQUE: Contiguous axial images were obtained from the base of the skull through the vertex without intravenous contrast. COMPARISON:  Sinus CT - 09/26/2017 FINDINGS: Brain: Gray-white differentiation is maintained. No CT evidence of acute large territory infarct. No intraparenchymal or extra-axial mass or hemorrhage. Unchanged size and configuration of the ventricles and basilar cisterns. 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 No midline shift. Vascular: No hyperdense vessel or unexpected calcification. Skull: No displaced calvarial fracture. Sinuses/Orbits: Redemonstrated mucosal thickening involving the bilateral anterior and posterior ethmoidal air cells as well as the bilateral sphenoid sinuses. Small air-fluid levels are seen within the bilateral maxillary sinuses, right greater than left, slightly progressed in the interval. Small air-fluid level seen with the left frontal sinus, unchanged. Partial opacification of the caudal aspect of the left mastoid air cells (image 3, series 4), new compared to the sinus CT performed day prior. Other: Regional soft tissues appear normal.  IMPRESSION: 1. No acute intracranial process. 2. Progression of sinus disease, including air-fluid levels within the bilateral maxillary and left frontal sinuses as detailed above. 3. Partial opacification of the caudal aspect left mastoid air cells as could be seen in the setting of early mastoiditis. Electronically Signed   By: Simonne Come M.D.   On: 09/27/2017 11:27   Ct Maxillofacial W Contrast  Result Date: 09/26/2017 CLINICAL DATA:  Sore throat and productive cough. Left-sided headache. EXAM: CT  MAXILLOFACIAL WITH CONTRAST TECHNIQUE: Multidetector CT imaging of the maxillofacial structures was performed with intravenous contrast. Multiplanar CT image reconstructions were also generated. CONTRAST:  75mL ISOVUE-300 IOPAMIDOL (ISOVUE-300) INJECTION 61% COMPARISON:  09/24/2017 FINDINGS: Osseous: No abnormal osseous finding. There is a small amount of fluid in the middle ear cavity on the left as seen on the previous study suggesting possible otitis media. This is somewhat improved. Orbits: Normal appearance of the orbits. No abnormality of the globes, optic nerves, extraocular muscles, orbital fat or lacrimal glands. Sinuses: Mild mucosal inflammation of the paranasal sinuses with small air-fluid levels in the maxillary sinuses. Maxillary sinus fluid is improved since the prior exam. Soft tissues: No other abnormality of the soft tissue face in the region imaged. This does include the oropharynx and tonsillar regions which appear unremarkable. Limited intracranial: Normal IMPRESSION: Sinusitis pattern, improved since the study of 3 days ago. Fluid levels in the maxillary sinuses are smaller. Small amount of fluid in the middle ear cavity on the left as seen previously suggesting possible otitis media, also improved. Otherwise negative exam. Electronically Signed   By: Paulina Fusi M.D.   On: 09/26/2017 07:12   Dg Foot 2 Views Right  Result Date: 09/13/2017 CLINICAL DATA:  Right foot pain/swelling/redness since yesterday morning. Hx gout EXAM: RIGHT FOOT - 2 VIEW COMPARISON:  None. FINDINGS: There is no evidence of fracture or dislocation. There is no evidence of arthropathy or other focal bone abnormality. Soft tissues are unremarkable. IMPRESSION: Negative. Electronically Signed   By: Corlis Leak M.D.   On: 09/13/2017 14:18   Ct Temporal Bones W Contrast  Addendum Date: 09/24/2017   ADDENDUM REPORT: 09/24/2017 22:26 ADDENDUM: Additional evaluation of the orbits was requested. The source data of the  temporal bone CT was reformatted for better assessment of the orbits and their contents. Orbits: --Globes: Normal. --Bony orbit: Normal. --Preseptal soft tissues: Minimally asymmetric edema of the left eyelid. --Intra- and extraconal orbital fat: Normal. No inflammatory stranding. --Optic nerves: Normal. --Lacrimal glands and fossae: Normal. --Extraocular muscles: Normal. Visualized sinuses: Bilateral maxillary sinus fluid levels. Minimal opacification of the frontal sinuses. IMPRESSION: No orbital cellulitis. Mild left eyelid edema could indicate mild periorbital cellulitis. These results were called by telephone at the time of interpretation on 09/24/2017 at 10:26 pm to Dr. Mariea Clonts, who verbally acknowledged these results. Electronically Signed   By: Deatra Robinson M.D.   On: 09/24/2017 22:26   Result Date: 09/24/2017 CLINICAL DATA:  Hearing loss, conductive. Seen in the emergency department for upper respiratory infection yesterday. Increased pressure in head. Now unable to hear from left ear. EXAM: CT TEMPORAL BONES WITH CONTRAST TECHNIQUE: Axial and coronal plane CT imaging of the petrous temporal bones was performed with thin-collimation image reconstruction after intravenous contrast administration. Multiplanar CT image reconstructions were also generated. CONTRAST:  75mL OMNIPAQUE IOHEXOL 300 MG/ML  SOLN COMPARISON:  CT head without contrast 07/26/2014 FINDINGS: Limited imaging of the brain is unremarkable. Focal enhancement is present. Fluid levels are present in the maxillary  sinuses bilaterally. There is scattered mucosal thickening throughout the ethmoid air cells. The right external auditory canal is within limits. Tympanic membrane is visualized and within limits. The right middle ear cavity is clear. Middle ear ossicles are normally formed and articulating. Oval window is patent. Epitympanum is clear. Mastoid air cells are clear. The inner ear structures are normally formed. 2.5 turns are present in  the cochlea. The superior and lateral semicircular canals are covered. The internal auditory canal and vestibular aqueduct are normal. There is some mucosal thickening within the external auditory canal. Fluid is present within the left middle ear cavity medial and inferior to the ossicles. Ossicles are intact. Normal articulation is present. The oval window is patent. There is fluid in the epitympanum and mastoid air cells without osseous destruction. Extraosseous soft tissue or enhancement is present. The inner ear structures are normally formed. 2.5 turns are present in the cochlea. The superior and lateral semicircular canals are covered. The internal auditory canal and vestibular aqueduct are normal. IMPRESSION: 1. Fluid in the left middle ear cavity and mastoid air cells may represent otitis media and/or mastoiditis. This may represent a simple effusion. No osseous destruction is present. 2. Mild mucosal thickening of the left external auditory canal supports an inflammatory process. 3. Fluid levels within the maxillary sinuses bilaterally compatible with acute sinusitis. Electronically Signed: By: Marin Robertshristopher  Mattern M.D. On: 09/24/2017 19:26    Catarina Hartshornavid Deleah Tison, DO  [facility] Pager [phone]  If 7PM-7AM, please contact night-coverage [email or web address] Password [email or web address] 09/28/2017, 5:07 PM   LOS: 4 days

## 2017-09-29 DIAGNOSIS — H70009 Acute mastoiditis without complications, unspecified ear: Secondary | ICD-10-CM

## 2017-09-29 DIAGNOSIS — I10 Essential (primary) hypertension: Secondary | ICD-10-CM

## 2017-09-29 DIAGNOSIS — J0100 Acute maxillary sinusitis, unspecified: Secondary | ICD-10-CM

## 2017-09-29 DIAGNOSIS — F322 Major depressive disorder, single episode, severe without psychotic features: Secondary | ICD-10-CM

## 2017-09-29 LAB — URINE CULTURE: CULTURE: NO GROWTH

## 2017-09-29 LAB — CBC
HCT: 40.3 % (ref 39.0–52.0)
HEMOGLOBIN: 13.4 g/dL (ref 13.0–17.0)
MCH: 30.9 pg (ref 26.0–34.0)
MCHC: 33.3 g/dL (ref 30.0–36.0)
MCV: 93.1 fL (ref 78.0–100.0)
Platelets: 218 10*3/uL (ref 150–400)
RBC: 4.33 MIL/uL (ref 4.22–5.81)
RDW: 12.9 % (ref 11.5–15.5)
WBC: 8.4 10*3/uL (ref 4.0–10.5)

## 2017-09-29 LAB — BASIC METABOLIC PANEL
ANION GAP: 6 (ref 5–15)
BUN: 8 mg/dL (ref 6–20)
CHLORIDE: 105 mmol/L (ref 98–111)
CO2: 29 mmol/L (ref 22–32)
Calcium: 8.5 mg/dL — ABNORMAL LOW (ref 8.9–10.3)
Creatinine, Ser: 0.97 mg/dL (ref 0.61–1.24)
GFR calc Af Amer: >60 mL/min (ref >60)
GFR calc non Af Amer: >60 mL/min (ref >60)
Glucose, Bld: 133 mg/dL — ABNORMAL HIGH (ref 70–99)
POTASSIUM: 3.5 mmol/L (ref 3.5–5.1)
SODIUM: 140 mmol/L (ref 135–145)

## 2017-09-29 MED ORDER — GABAPENTIN 300 MG PO CAPS: 300.0000 mg | ORAL_CAPSULE | Freq: Three times a day (TID) | ORAL | 0 refills | Status: DC

## 2017-09-29 MED ORDER — OXYCODONE HCL 10 MG PO TABS: 10.0000 mg | ORAL_TABLET | Freq: Four times a day (QID) | ORAL | 0 refills | Status: DC | PRN

## 2017-09-29 MED ORDER — AMOXICILLIN-POT CLAVULANATE 875-125 MG PO TABS: 1.0000 | ORAL_TABLET | Freq: Two times a day (BID) | ORAL | 0 refills | Status: DC

## 2017-09-29 MED ORDER — DOXYCYCLINE HYCLATE 100 MG PO TABS: 100.0000 mg | ORAL_TABLET | Freq: Two times a day (BID) | ORAL | 0 refills | Status: DC

## 2017-09-29 NOTE — Discharge Summary (Addendum)
Physician Discharge Summary  James Fisher ZOX:[id number] DOB: 01-Apr-1970 DOA: 09/24/2017  PCP: Patient, No Pcp Per  Admit date: 09/24/2017 Discharge date: 09/29/2017  Admitted From: Home Disposition:  Home   Recommendations for Outpatient Follow-up:  1. Follow up with PCP in 1-2 weeks 2. Please obtain BMP/CBC in one week    Discharge Condition: Stable CODE STATUS: FULL Diet recommendation: Heart Healthy    Brief/Interim Summary: 47 y.o.malewith medical history significantalcoholism,anxiety and depression, HTNwho presented to the ED with c/oworseningleft facial swelling associated with a sore throat left-sided hearing loss and left eye redness. Symptoms persisted despite several tablets of Excedrin, Advil and DayQuil.. To the ED on 09/23/2017 due to persistent and worsening pain and swelling of left face.Temporal bones CT with contrast revealed fluid in the left middle ear cavity that may represent otitis media. Mastoid air cells were clear.Also mild mucosal thickening of the left external auditory canal. Admitted for suspected periorbital cellulitis and acute maxillary cellulitis. Startedon IV antibiotics.Due to continued pain, CT maxillofacial was obtained which showed improvement in maxillary sinus fluid and left middle ear fluid. Ultimately, discussed with ENT on separate occasions.  Discussed with Dr. Lazarus Salines who reviewed CTs and felt pt could be managed medically.  Ultimately, patient gradually improved.  He remained afebrile and hemodynamically stable.  He will be d/ced with amox/clav and doxy x 9 more days to complete 14 days of tx.   Discharge Diagnoses:  Periorbital cellulitis/acute sinusitis/Acute mastoiditis -edema and erythema improved -CT temporal bones with contrast revealed suspected periorbital cellulitis -Dr. Margo Aye spoke with ENT, Dr. Debria Garret c/w IV antibiotics- If no improvement maxillofacial CT with contrast -09/26/17-CT  maxillofacial-->improving sinus pattern -continue oxycontin -pt request change to morphine 4 mg  -Senokot 1 tablet twice daily and MiraLAX daily as bowel regiment to avoid constipation -clinically, pain appears out of proportion with objective findings -addedgabapentin -09/27/17--Case discussed with ENT, Dr. Brendolyn Patty not impressive; continue non-operative management; add afrin -started vancomycin -d/c home with doxy and amox/clav x 9 more days -d/c home with oxycodone 10 mg, #18, no refills  Nausea and vomiting -likely due to opioids -CT brain with complaints of new headache -decrease morphine to 2 mg IV q4 prn -improved -advance diet  Accelerated hypertension in the setting of essential hypertension -Suspect uncontrolled hypertension is contributed by pain -Improvedwithpain management -Continue home medications on lisinopril -IV hydralazine 10 mg PRN for systolic blood pressure greater than 180 or diastolic blood pressure greater than 105  Chronic anxiety/depression -Continue BuSpar -Trazodone as needed at night for sleep -no SI  GERD -Continue Protonix     Discharge Instructions   Allergies as of 09/29/2017      Reactions   Bee Venom Swelling   Onion Hives   Mushroom Extract Complex Hives      Medication List    STOP taking these medications   FLUoxetine 10 MG capsule Commonly known as:  PROZAC   hydrOXYzine 25 MG tablet Commonly known as:  ATARAX/VISTARIL   hydrOXYzine 50 MG tablet Commonly known as:  ATARAX/VISTARIL   pantoprazole 40 MG tablet Commonly known as:  PROTONIX     TAKE these medications   amoxicillin-clavulanate 875-125 MG tablet Commonly known as:  AUGMENTIN Take 1 tablet by mouth 2 (two) times daily.   busPIRone 5 MG tablet Commonly known as:  BUSPAR Take 1 tablet (5 mg total) by mouth 3 (three) times daily.   Chlorpheniramine-Phenylephrine 4-10 MG tablet Take 1 tablet by mouth every 4 (four) hours as needed for  congestion.  doxycycline 100 MG tablet Commonly known as:  VIBRA-TABS Take 1 tablet (100 mg total) by mouth 2 (two) times daily.   gabapentin 300 MG capsule Commonly known as:  NEURONTIN Take 1 capsule (300 mg total) by mouth 3 (three) times daily.   lisinopril 10 MG tablet Commonly known as:  PRINIVIL,ZESTRIL Take 1 tablet (10 mg total) by mouth daily.   Oxycodone HCl 10 MG Tabs Take 1 tablet (10 mg total) by mouth every 6 (six) hours as needed (pain).   traZODone 100 MG tablet Commonly known as:  DESYREL Take 1 tablet (100 mg total) by mouth at bedtime as needed for sleep (please give at 9 PM).       Allergies  Allergen Reactions  . Bee Venom Swelling  . Onion Hives  . Mushroom Extract Complex Hives    Consultations:  [facility]   Procedures/Studies: Ct Head Wo Contrast  Result Date: 09/27/2017 CLINICAL DATA:  Left-sided headache, worse this morning. EXAM: CT HEAD WITHOUT CONTRAST TECHNIQUE: Contiguous axial images were obtained from the base of the skull through the vertex without intravenous contrast. COMPARISON:  Sinus CT - 09/26/2017 FINDINGS: Brain: Gray-white differentiation is maintained. No CT evidence of acute large territory infarct. No intraparenchymal or extra-axial mass or hemorrhage. Unchanged size and configuration of the ventricles and basilar cisterns. 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 No midline shift. Vascular: No hyperdense vessel or unexpected calcification. Skull: No displaced calvarial fracture. Sinuses/Orbits: Redemonstrated mucosal thickening involving the bilateral anterior and posterior ethmoidal air cells as well as the bilateral sphenoid sinuses. Small air-fluid levels are seen within the bilateral maxillary sinuses, right greater than left, slightly progressed in the interval. Small air-fluid level seen with the left frontal sinus, unchanged. Partial opacification of the caudal aspect of the left mastoid air cells (image 3,  series 4), new compared to the sinus CT performed day prior. Other: Regional soft tissues appear normal. IMPRESSION: 1. No acute intracranial process. 2. Progression of sinus disease, including air-fluid levels within the bilateral maxillary and left frontal sinuses as detailed above. 3. Partial opacification of the caudal aspect left mastoid air cells as could be seen in the setting of early mastoiditis. Electronically Signed   By: Simonne Come M.D.   On: 09/27/2017 11:27   Ct Maxillofacial W Contrast  Result Date: 09/26/2017 CLINICAL DATA:  Sore throat and productive cough. Left-sided headache. EXAM: CT MAXILLOFACIAL WITH CONTRAST TECHNIQUE: Multidetector CT imaging of the maxillofacial structures was performed with intravenous contrast. Multiplanar CT image reconstructions were also generated. CONTRAST:  75mL ISOVUE-300 IOPAMIDOL (ISOVUE-300) INJECTION 61% COMPARISON:  09/24/2017 FINDINGS: Osseous: No abnormal osseous finding. There is a small amount of fluid in the middle ear cavity on the left as seen on the previous study suggesting possible otitis media. This is somewhat improved. Orbits: Normal appearance of the orbits. No abnormality of the globes, optic nerves, extraocular muscles, orbital fat or lacrimal glands. Sinuses: Mild mucosal inflammation of the paranasal sinuses with small air-fluid levels in the maxillary sinuses. Maxillary sinus fluid is improved since the prior exam. Soft tissues: No other abnormality of the soft tissue face in the region imaged. This does include the oropharynx and tonsillar regions which appear unremarkable. Limited intracranial: Normal IMPRESSION: Sinusitis pattern, improved since the study of 3 days ago. Fluid levels in the maxillary sinuses are smaller. Small amount of fluid in the middle ear cavity on the left as seen previously suggesting possible otitis media, also improved. Otherwise negative exam.  Electronically Signed   By: Paulina FusiMark  Shogry M.D.   On: 09/26/2017 07:12    Dg Foot 2 Views Right  Result Date: 09/13/2017 CLINICAL DATA:  Right foot pain/swelling/redness since yesterday morning. Hx gout EXAM: RIGHT FOOT - 2 VIEW COMPARISON:  None. FINDINGS: There is no evidence of fracture or dislocation. There is no evidence of arthropathy or other focal bone abnormality. Soft tissues are unremarkable. IMPRESSION: Negative. Electronically Signed   By: Corlis Leak  Hassell M.D.   On: 09/13/2017 14:18   Ct Temporal Bones W Contrast  Addendum Date: 09/24/2017   ADDENDUM REPORT: 09/24/2017 22:26 ADDENDUM: Additional evaluation of the orbits was requested. The source data of the temporal bone CT was reformatted for better assessment of the orbits and their contents. Orbits: --Globes: Normal. --Bony orbit: Normal. --Preseptal soft tissues: Minimally asymmetric edema of the left eyelid. --Intra- and extraconal orbital fat: Normal. No inflammatory stranding. --Optic nerves: Normal. --Lacrimal glands and fossae: Normal. --Extraocular muscles: Normal. Visualized sinuses: Bilateral maxillary sinus fluid levels. Minimal opacification of the frontal sinuses. IMPRESSION: No orbital cellulitis. Mild left eyelid edema could indicate mild periorbital cellulitis. These results were called by telephone at the time of interpretation on 09/24/2017 at 10:26 pm to Dr. Mariea ClontsEmokpae, who verbally acknowledged these results. Electronically Signed   By: Deatra RobinsonKevin  Herman M.D.   On: 09/24/2017 22:26   Result Date: 09/24/2017 CLINICAL DATA:  Hearing loss, conductive. Seen in the emergency department for upper respiratory infection yesterday. Increased pressure in head. Now unable to hear from left ear. EXAM: CT TEMPORAL BONES WITH CONTRAST TECHNIQUE: Axial and coronal plane CT imaging of the petrous temporal bones was performed with thin-collimation image reconstruction after intravenous contrast administration. Multiplanar CT image reconstructions were also generated. CONTRAST:  75mL OMNIPAQUE IOHEXOL 300 MG/ML  SOLN  COMPARISON:  CT head without contrast 07/26/2014 FINDINGS: Limited imaging of the brain is unremarkable. Focal enhancement is present. Fluid levels are present in the maxillary sinuses bilaterally. There is scattered mucosal thickening throughout the ethmoid air cells. The right external auditory canal is within limits. Tympanic membrane is visualized and within limits. The right middle ear cavity is clear. Middle ear ossicles are normally formed and articulating. Oval window is patent. Epitympanum is clear. Mastoid air cells are clear. The inner ear structures are normally formed. 2.5 turns are present in the cochlea. The superior and lateral semicircular canals are covered. The internal auditory canal and vestibular aqueduct are normal. There is some mucosal thickening within the external auditory canal. Fluid is present within the left middle ear cavity medial and inferior to the ossicles. Ossicles are intact. Normal articulation is present. The oval window is patent. There is fluid in the epitympanum and mastoid air cells without osseous destruction. Extraosseous soft tissue or enhancement is present. The inner ear structures are normally formed. 2.5 turns are present in the cochlea. The superior and lateral semicircular canals are covered. The internal auditory canal and vestibular aqueduct are normal. IMPRESSION: 1. Fluid in the left middle ear cavity and mastoid air cells may represent otitis media and/or mastoiditis. This may represent a simple effusion. No osseous destruction is present. 2. Mild mucosal thickening of the left external auditory canal supports an inflammatory process. 3. Fluid levels within the maxillary sinuses bilaterally compatible with acute sinusitis. Electronically Signed: By: Marin Robertshristopher  Mattern M.D. On: 09/24/2017 19:26        Discharge Exam: Vitals:   09/28/17 2120 09/29/17 0613  BP: (!) 152/89 114/68  Pulse: 75 (!) 59  Resp:  18 18  Temp: 98.3 F (36.8 C) 98 F (36.7  C)  SpO2: 96% 97%   Vitals:   09/28/17 0534 09/28/17 1338 09/28/17 2120 09/29/17 0613  BP: 94/71 123/76 (!) 152/89 114/68  Pulse: 65 76 75 (!) 59  Resp: 18 18 18 18   Temp: 98.2 F (36.8 C) 99.4 F (37.4 C) 98.3 F (36.8 C) 98 F (36.7 C)  TempSrc: Oral Oral Oral Oral  SpO2: 94% 97% 96% 97%  Weight:      Height:        General: Pt is alert, awake, not in acute distress Cardiovascular: RRR, S1/S2 +, no rubs, no gallops Respiratory: CTA bilaterally, no wheezing, no rhonchi Abdominal: Soft, NT, ND, bowel sounds + Extremities: no edema, no cyanosis   The results of significant diagnostics from this hospitalization (including imaging, microbiology, ancillary and laboratory) are listed below for reference.    Significant Diagnostic Studies: Ct Head Wo Contrast  Result Date: 09/27/2017 CLINICAL DATA:  Left-sided headache, worse this morning. EXAM: CT HEAD WITHOUT CONTRAST TECHNIQUE: Contiguous axial images were obtained from the base of the skull through the vertex without intravenous contrast. COMPARISON:  Sinus CT - 09/26/2017 FINDINGS: Brain: Gray-white differentiation is maintained. No CT evidence of acute large territory infarct. No intraparenchymal or extra-axial mass or hemorrhage. Unchanged size and configuration of the ventricles and basilar cisterns. 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 No midline shift. Vascular: No hyperdense vessel or unexpected calcification. Skull: No displaced calvarial fracture. Sinuses/Orbits: Redemonstrated mucosal thickening involving the bilateral anterior and posterior ethmoidal air cells as well as the bilateral sphenoid sinuses. Small air-fluid levels are seen within the bilateral maxillary sinuses, right greater than left, slightly progressed in the interval. Small air-fluid level seen with the left frontal sinus, unchanged. Partial opacification of the caudal aspect of the left mastoid air cells (image 3, series 4), new compared to the sinus  CT performed day prior. Other: Regional soft tissues appear normal. IMPRESSION: 1. No acute intracranial process. 2. Progression of sinus disease, including air-fluid levels within the bilateral maxillary and left frontal sinuses as detailed above. 3. Partial opacification of the caudal aspect left mastoid air cells as could be seen in the setting of early mastoiditis. Electronically Signed   By: Simonne Come M.D.   On: 09/27/2017 11:27   Ct Maxillofacial W Contrast  Result Date: 09/26/2017 CLINICAL DATA:  Sore throat and productive cough. Left-sided headache. EXAM: CT MAXILLOFACIAL WITH CONTRAST TECHNIQUE: Multidetector CT imaging of the maxillofacial structures was performed with intravenous contrast. Multiplanar CT image reconstructions were also generated. CONTRAST:  75mL ISOVUE-300 IOPAMIDOL (ISOVUE-300) INJECTION 61% COMPARISON:  09/24/2017 FINDINGS: Osseous: No abnormal osseous finding. There is a small amount of fluid in the middle ear cavity on the left as seen on the previous study suggesting possible otitis media. This is somewhat improved. Orbits: Normal appearance of the orbits. No abnormality of the globes, optic nerves, extraocular muscles, orbital fat or lacrimal glands. Sinuses: Mild mucosal inflammation of the paranasal sinuses with small air-fluid levels in the maxillary sinuses. Maxillary sinus fluid is improved since the prior exam. Soft tissues: No other abnormality of the soft tissue face in the region imaged. This does include the oropharynx and tonsillar regions which appear unremarkable. Limited intracranial: Normal IMPRESSION: Sinusitis pattern, improved since the study of 3 days ago. Fluid levels in the maxillary sinuses are smaller. Small amount of fluid in the middle ear cavity on the left as seen previously suggesting possible otitis  media, also improved. Otherwise negative exam. Electronically Signed   By: Paulina Fusi M.D.   On: 09/26/2017 07:12   Dg Foot 2 Views Right  Result  Date: 09/13/2017 CLINICAL DATA:  Right foot pain/swelling/redness since yesterday morning. Hx gout EXAM: RIGHT FOOT - 2 VIEW COMPARISON:  None. FINDINGS: There is no evidence of fracture or dislocation. There is no evidence of arthropathy or other focal bone abnormality. Soft tissues are unremarkable. IMPRESSION: Negative. Electronically Signed   By: Corlis Leak M.D.   On: 09/13/2017 14:18   Ct Temporal Bones W Contrast  Addendum Date: 09/24/2017   ADDENDUM REPORT: 09/24/2017 22:26 ADDENDUM: Additional evaluation of the orbits was requested. The source data of the temporal bone CT was reformatted for better assessment of the orbits and their contents. Orbits: --Globes: Normal. --Bony orbit: Normal. --Preseptal soft tissues: Minimally asymmetric edema of the left eyelid. --Intra- and extraconal orbital fat: Normal. No inflammatory stranding. --Optic nerves: Normal. --Lacrimal glands and fossae: Normal. --Extraocular muscles: Normal. Visualized sinuses: Bilateral maxillary sinus fluid levels. Minimal opacification of the frontal sinuses. IMPRESSION: No orbital cellulitis. Mild left eyelid edema could indicate mild periorbital cellulitis. These results were called by telephone at the time of interpretation on 09/24/2017 at 10:26 pm to Dr. Mariea Clonts, who verbally acknowledged these results. Electronically Signed   By: Deatra Robinson M.D.   On: 09/24/2017 22:26   Result Date: 09/24/2017 CLINICAL DATA:  Hearing loss, conductive. Seen in the emergency department for upper respiratory infection yesterday. Increased pressure in head. Now unable to hear from left ear. EXAM: CT TEMPORAL BONES WITH CONTRAST TECHNIQUE: Axial and coronal plane CT imaging of the petrous temporal bones was performed with thin-collimation image reconstruction after intravenous contrast administration. Multiplanar CT image reconstructions were also generated. CONTRAST:  75mL OMNIPAQUE IOHEXOL 300 MG/ML  SOLN COMPARISON:  CT head without contrast  07/26/2014 FINDINGS: Limited imaging of the brain is unremarkable. Focal enhancement is present. Fluid levels are present in the maxillary sinuses bilaterally. There is scattered mucosal thickening throughout the ethmoid air cells. The right external auditory canal is within limits. Tympanic membrane is visualized and within limits. The right middle ear cavity is clear. Middle ear ossicles are normally formed and articulating. Oval window is patent. Epitympanum is clear. Mastoid air cells are clear. The inner ear structures are normally formed. 2.5 turns are present in the cochlea. The superior and lateral semicircular canals are covered. The internal auditory canal and vestibular aqueduct are normal. There is some mucosal thickening within the external auditory canal. Fluid is present within the left middle ear cavity medial and inferior to the ossicles. Ossicles are intact. Normal articulation is present. The oval window is patent. There is fluid in the epitympanum and mastoid air cells without osseous destruction. Extraosseous soft tissue or enhancement is present. The inner ear structures are normally formed. 2.5 turns are present in the cochlea. The superior and lateral semicircular canals are covered. The internal auditory canal and vestibular aqueduct are normal. IMPRESSION: 1. Fluid in the left middle ear cavity and mastoid air cells may represent otitis media and/or mastoiditis. This may represent a simple effusion. No osseous destruction is present. 2. Mild mucosal thickening of the left external auditory canal supports an inflammatory process. 3. Fluid levels within the maxillary sinuses bilaterally compatible with acute sinusitis. Electronically Signed: By: Marin Roberts M.D. On: 09/24/2017 19:26     Microbiology: Recent Results (from the past 240 hour(s))  Culture, Urine     Status: None  Collection Time: 09/27/17  3:00 PM  Result Value Ref Range Status   Specimen Description   Final     URINE, CLEAN CATCH Performed at [facility], [address], [place], [place] [address]    Special Requests   Final    NONE Performed at [facility], [address], [place], [place] [address]    Culture   Final    NO GROWTH Performed at [facility], [address], [place], [place] [address]    Report Status 09/29/2017 FINAL  Final     Labs: Basic Metabolic Panel: Recent Labs  Lab 09/24/17 1724 09/25/17 0358 09/26/17 0517 09/29/17 0430  NA 141 142 142 140  K 3.2* 5.2* 4.2 3.5  CL 106 108 106 105  CO2 24 29 28 29   GLUCOSE 119* 161* 110* 133*  BUN 13 10 12 8   CREATININE 1.15 1.08 1.01 0.97  CALCIUM 9.4 9.3 8.6* 8.5*  MG 2.2  --   --   --    Liver Function Tests: Recent Labs  Lab 09/26/17 0517  AST 26  ALT 40  ALKPHOS 96  BILITOT 0.7  PROT 6.2*  ALBUMIN 3.4*   No results for input(s): LIPASE, AMYLASE in the last 168 hours. No results for input(s): AMMONIA in the last 168 hours. CBC: Recent Labs  Lab 09/24/17 1724 09/25/17 0358 09/26/17 0517 09/27/17 0620 09/29/17 0430  WBC 12.9* 9.0 11.1* 11.6* 8.4  NEUTROABS 8.5*  --   --   --   --   HGB 15.2 14.3 13.9 14.0 13.4  HCT 43.0 42.2 41.3 41.8 40.3  MCV 90.7 92.3 93.0 92.7 93.1  PLT 245 246 225 255 218   Cardiac Enzymes: No results for input(s): CKTOTAL, CKMB, CKMBINDEX, TROPONINI in the last 168 hours. BNP: Invalid input(s): POCBNP CBG: No results for input(s): GLUCAP in the last 168 hours.  Time coordinating discharge:  36 minutes  Signed:  Catarina Hartshorn, DO [facility] Pager: [phone] 09/29/2017, 11:03 AM

## 2017-09-29 NOTE — Progress Notes (Signed)
IV removed, WNL. D/C instructions given to pt, verbalized understanding. Prescriptions also given to pt. Pt awaiting transport home which is to arrive around 1300.

## 2018-11-13 ENCOUNTER — Telehealth: Payer: Self-pay | Admitting: General Practice

## 2018-11-13 NOTE — Telephone Encounter (Signed)
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 He is a [employer] Administrator. He took 5 one this morning at 6:40am, 9am and 3 more in the office. He saw Dr.Hunt and was recommended to see [place] up here before hitting the road. His mother is your patient, Aadyn Buchheit. He has to leave Wednesday morning for 3/4 weeks. He wanted to know if there was any way you could work him in as a new patient before he leave to see him for his elevated BP issues. Please advise.

## 2018-11-16 NOTE — Telephone Encounter (Signed)
Pt called back to see if we could get him today or tomorrow as a new patient and I talked with Dr.Metheney's assistant and confirmed we do not have any new patient openings today or tomorrow with Dr.Metheney. Patient has been informed

## 2018-11-17 NOTE — Telephone Encounter (Signed)
Since we are short 2 providers we are booked but he is welcome to estab in a week or two.

## 2018-12-24 NOTE — Telephone Encounter (Signed)
Mother called stating that patient is coming back into town in two weeks and she will call us with a date so we can make a new patient appointment with Dr.Metheney. Mother, Jehan Bonano, is your patient.

## 2019-01-11 IMAGING — CT CT MAXILLOFACIAL W/ CM
3 of 4 series · 16 of 47 positions shown, 19 images · IV contrast (Isovue)
Comparison: 09/24/2017

CLINICAL DATA: Sore throat and productive cough. Left-sided
headache.

EXAM:
CT MAXILLOFACIAL WITH CONTRAST
TECHNIQUE: Multidetector CT imaging of the maxillofacial structures was
performed with intravenous contrast. Multiplanar CT image
reconstructions were also generated.
CONTRAST:  75mL FGCEUK-YXX IOPAMIDOL (FGCEUK-YXX) INJECTION 61%

[Series 2: max soft · axial · 0.35mm/px · z∈[-73,+91]mm · 11 of 96 slices shown, 14 images]
[im 7/96  brain]
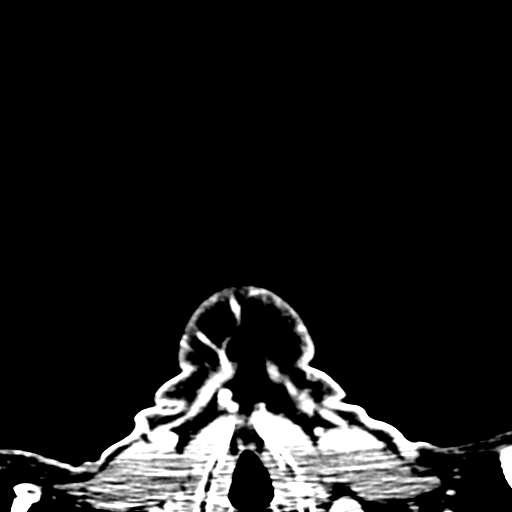
[im 7/96  bone]
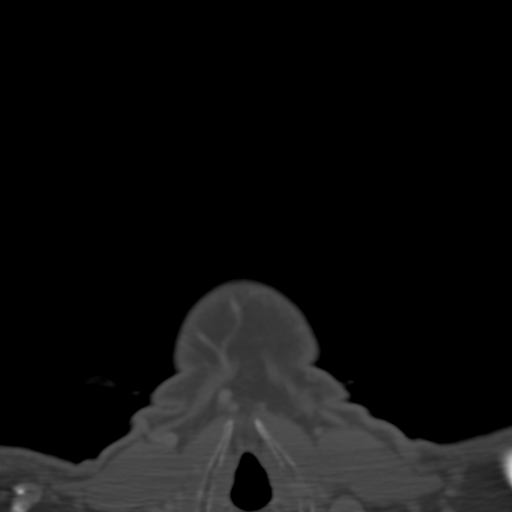
[im 14/96  bone]
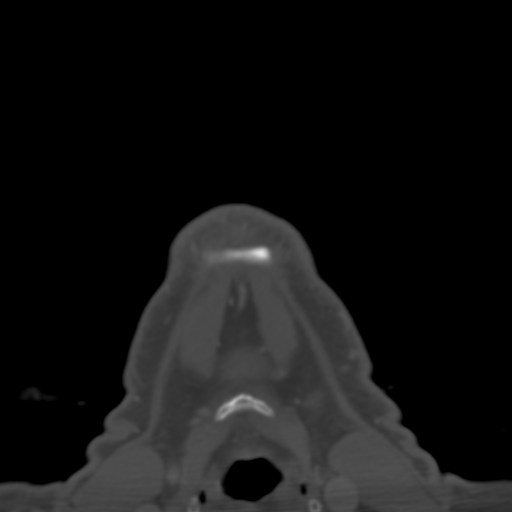
[im 23/96  bone]
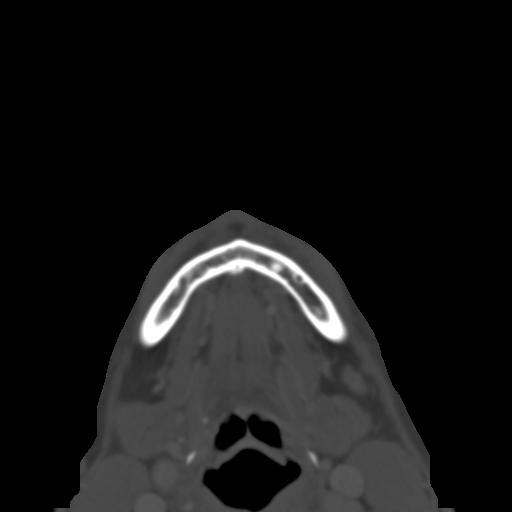
[im 30/96  bone]
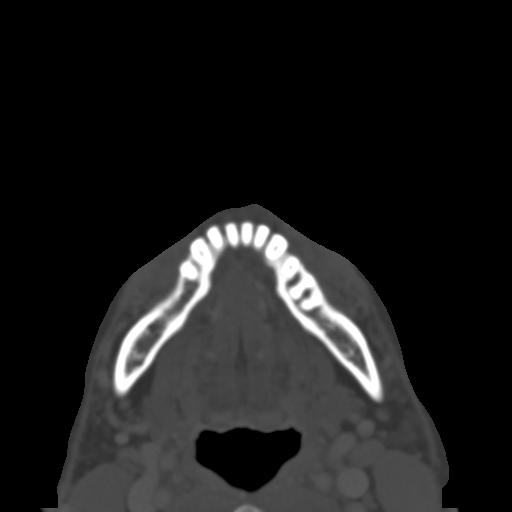
[im 40/96  brain]
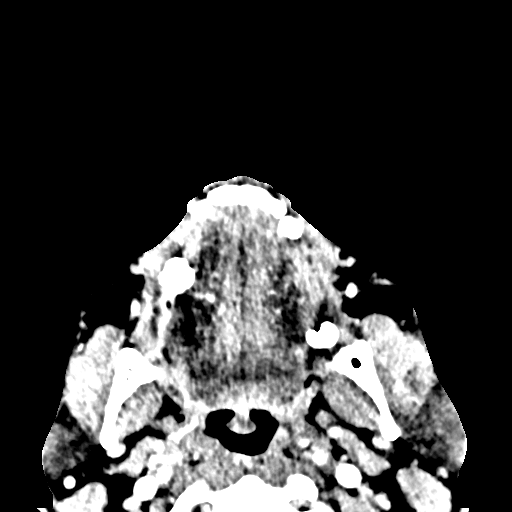
[im 40/96  bone]
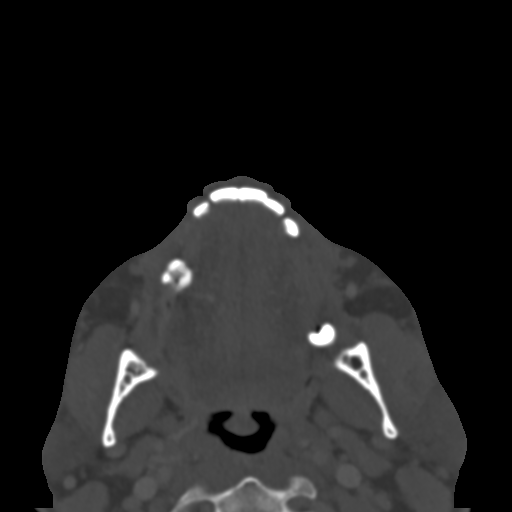
[im 50/96  bone]
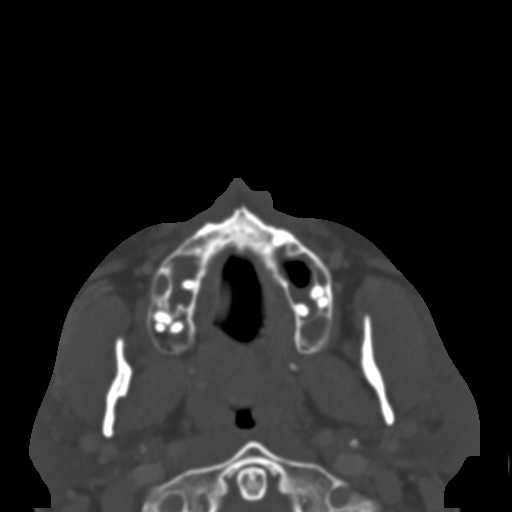
[im 56/96  bone]
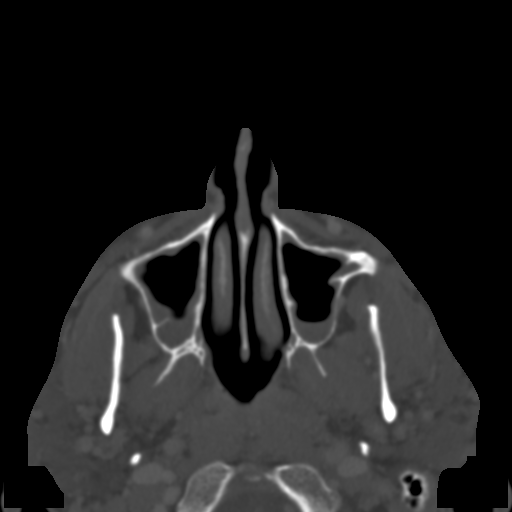
[im 66/96  bone]
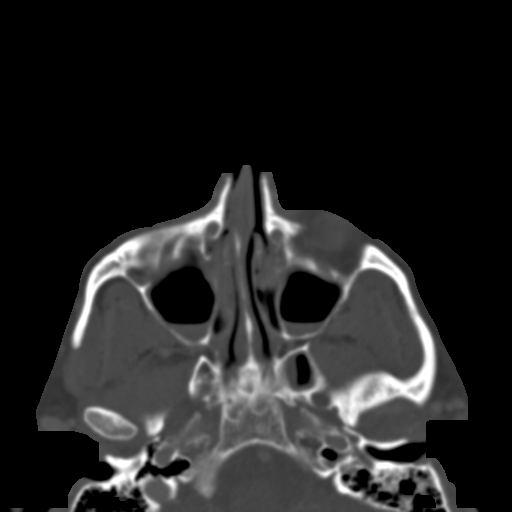
[im 73/96  brain]
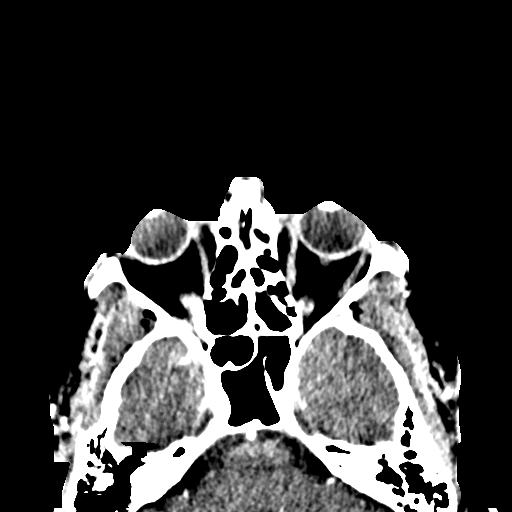
[im 73/96  bone]
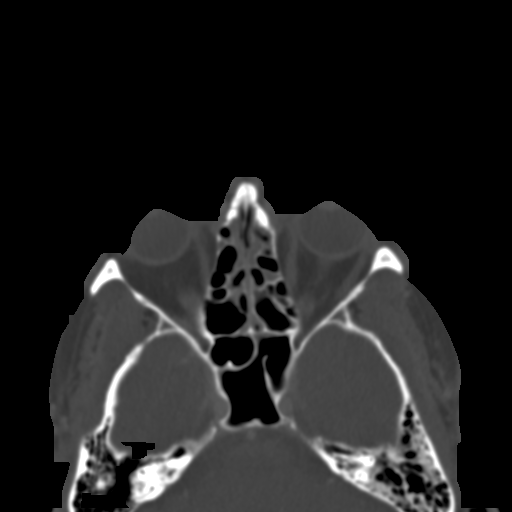
[im 82/96  bone]
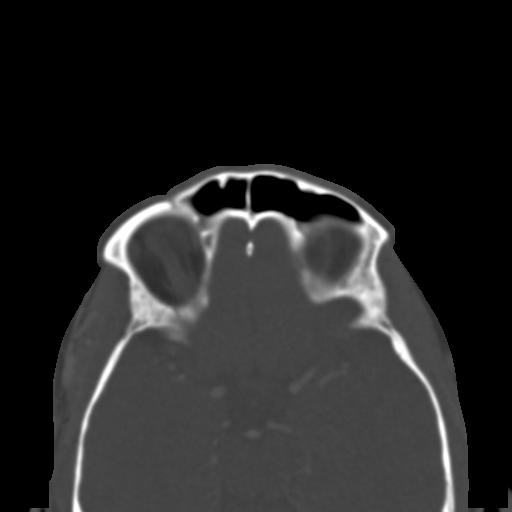
[im 89/96  bone]
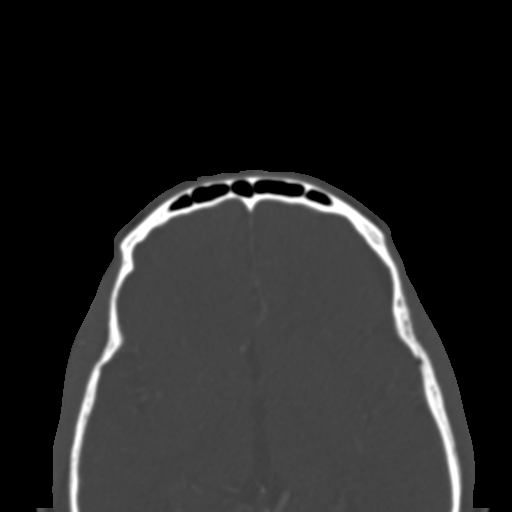

[Series 6: coronal soft · coronal · 0.36mm/px · 3 of 67 slices shown]
[im 23/67  bone]
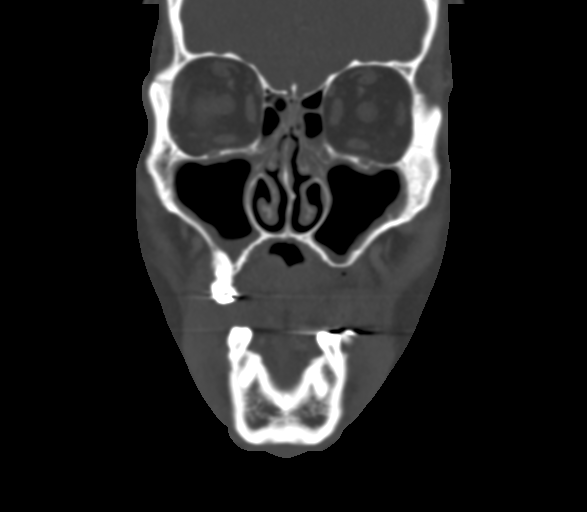
[im 30/67  bone]
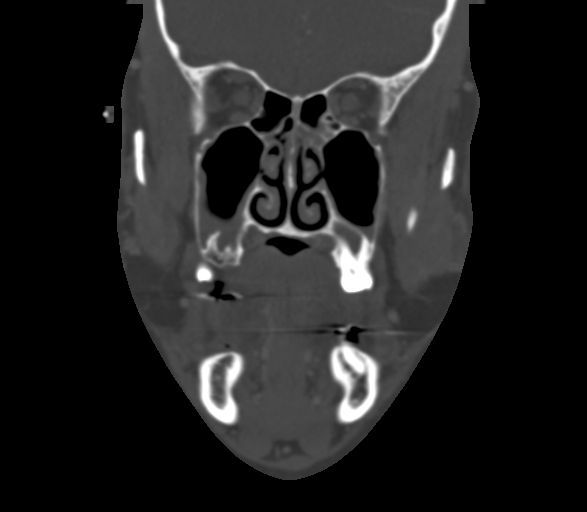
[im 37/67  bone]
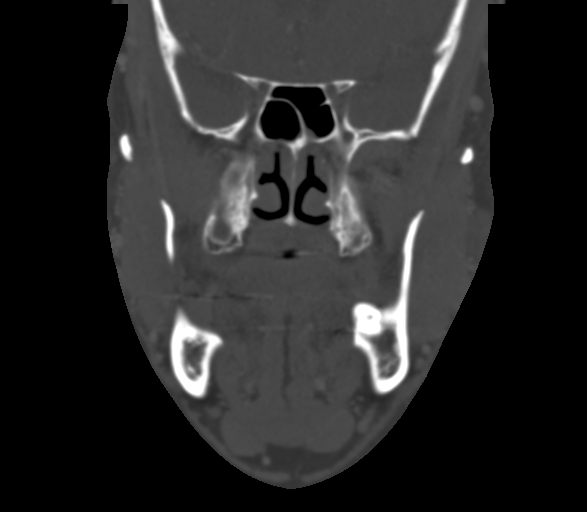

[Series 9: sagittal bone · sagittal · 0.35mm/px · 2 of 86 slices shown]
[im 29/86  bone]
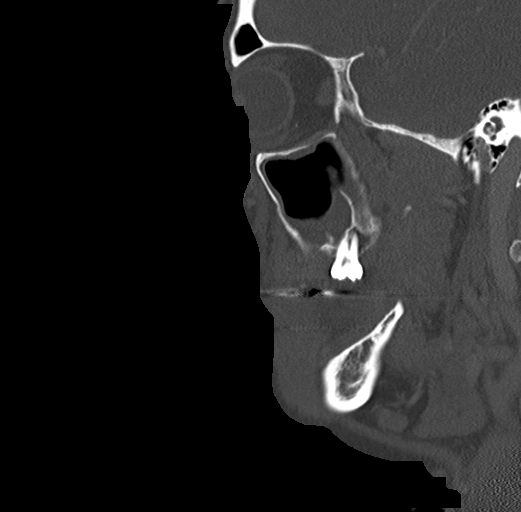
[im 57/86  bone]
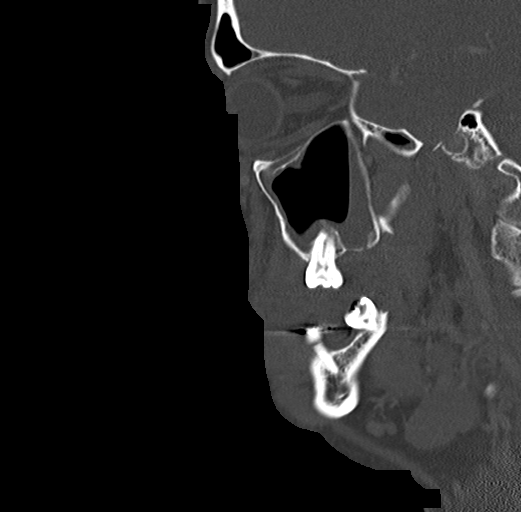

[16 of 47 positions shown; findings below may reference images not displayed]

FINDINGS: Osseous: No abnormal osseous finding. There is a small amount of
fluid in the middle ear cavity on the left as seen on the previous
study suggesting possible otitis media. This is somewhat improved.

Orbits: Normal appearance of the orbits. No abnormality of the
globes, optic nerves, extraocular muscles, orbital fat or lacrimal
glands.

Sinuses: Mild mucosal inflammation of the paranasal sinuses with
small air-fluid levels in the maxillary sinuses. Maxillary sinus
fluid is improved since the prior exam.

Soft tissues: No other abnormality of the soft tissue face in the
region imaged. This does include the oropharynx and tonsillar
regions which appear unremarkable.

Limited intracranial: Normal
IMPRESSION: Sinusitis pattern, improved since the study of 3 days ago. Fluid
levels in the maxillary sinuses are smaller. Small amount of fluid
in the middle ear cavity on the left as seen previously suggesting
possible otitis media, also improved. Otherwise negative exam.

## 2019-01-14 ENCOUNTER — Encounter: Payer: Self-pay | Admitting: Family Medicine

## 2019-01-14 ENCOUNTER — Other Ambulatory Visit: Payer: Self-pay

## 2019-01-14 ENCOUNTER — Ambulatory Visit (INDEPENDENT_AMBULATORY_CARE_PROVIDER_SITE_OTHER): Payer: Self-pay | Admitting: Family Medicine

## 2019-01-14 VITALS — BP 157/98 | HR 77 | Ht 72.0 in | Wt 214.0 lb

## 2019-01-14 DIAGNOSIS — F322 Major depressive disorder, single episode, severe without psychotic features: Secondary | ICD-10-CM

## 2019-01-14 DIAGNOSIS — F411 Generalized anxiety disorder: Secondary | ICD-10-CM

## 2019-01-14 DIAGNOSIS — I10 Essential (primary) hypertension: Secondary | ICD-10-CM

## 2019-01-14 MED ORDER — PAROXETINE HCL 10 MG PO TABS: 10.0000 mg | ORAL_TABLET | Freq: Every day | ORAL | 0 refills | Status: DC

## 2019-01-14 NOTE — Progress Notes (Signed)
New Patient Office Visit  Subjective:  Patient ID: James Fisher, male    DOB: [birth year]  Age: 48 y.o. MRN: [id number]  CC:  Chief Complaint  Patient presents with  . Establish Care    HPI James Fisher presents for new onset Hypertension.  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  He tried amlodipine recently But caused nephric and lower extremity swelling so he stopped it..  He has had chest pains on and off x 20 years but has been worse in the last 6 months.  Has had a fear of dying. Has had numbness and tingling on his left side that started in October.  He feels like he has a lot of anxiety and maybe some depression.  He was on Paxil at one time for this.  He says it was actually really helpful.  He also reports that several years ago he was actually on thyroid replacement medication for almost 3 years.  He does have a family history of hypothyroidism in his mother.  But says he has not been on medication in a couple years now.  He says he was on [place] Thyroid and felt good on it.  Past Medical History:  Diagnosis Date  . Depression   . Gout   . Hypertension     No past surgical history on file.  No family history on file.  Social History   Socioeconomic History  . Marital status: Single    Spouse name: Not on file  . Number of children: Not on file  . Years of education: Not on file  . Highest education level: Not on file  Occupational History  . Not on file  Social Needs  . Financial resource strain: Not on file  . Food insecurity    Worry: Not on file    Inability: Not on file  . Transportation needs    Medical: Not on file    Non-medical: Not on file  Tobacco Use  . Smoking status: Never Smoker  . Smokeless tobacco: Never Used  Substance and Sexual Activity  . Alcohol use: Yes    Comment: 50-60 beers a week  . Drug use: Not Currently  . Sexual activity: Not on file  Lifestyle  . Physical activity    Days per week:  Not on file    Minutes per session: Not on file  . Stress: Not on file  Relationships  . Social Musician on phone: Not on file    Gets together: Not on file    Attends religious service: Not on file    Active member of club or organization: Not on file    Attends meetings of clubs or organizations: Not on file    Relationship status: Not on file  . Intimate partner violence    Fear of current or ex partner: Not on file    Emotionally abused: Not on file    Physically abused: Not on file    Forced sexual activity: Not on file  Other Topics Concern  . Not on file  Social History Narrative  . Not on file    ROS Review of Systems  Objective:   Today's Vitals: BP (!) 157/98   Pulse 77   Ht 6' (1.829 m)   Wt 214 lb (97.1 kg)   SpO2 97%   BMI 29.02 kg/m   Physical Exam Constitutional:      Appearance:  He is well-developed.  HENT:     Head: Normocephalic and atraumatic.  Cardiovascular:     Rate and Rhythm: Normal rate and regular rhythm.     Heart sounds: Normal heart sounds.  Pulmonary:     Effort: Pulmonary effort is normal.     Breath sounds: Normal breath sounds.  Skin:    General: Skin is warm and dry.  Neurological:     Mental Status: He is alert and oriented to person, place, and time.  Psychiatric:        Behavior: Behavior normal.     Assessment & Plan:   Problem List Items Addressed This Visit      Cardiovascular and Mediastinum   Essential hypertension, benign - Primary    Pressure not well controlled.  Amlodipine added to intolerance list.  We will check his labs today if renal function is normal we will start lisinopril HCTZ 20/12.5.  We will send over to pharmacy.  If he has any problems he can let us know.  Encouraged him to get a home blood pressure cuff and check it on his own a couple times a week and send [place] a MyChart note in a couple weeks let us know how it is doing so that we can adjust his regimen if needed.  Explained that it  can take up to 2 weeks for the blood pressure medication to reach full efficacy.      Relevant Orders   COMPLETE METABOLIC PANEL WITH GFR   CBC   TSH     Other   MDD (major depressive disorder), severe ([place])    He just recently restarted his Paxil.  Explained that it can take 6 to 8 weeks to reach full efficacy.  It is a low dose and can always be adjusted if needed.      Relevant Medications   PARoxetine (PAXIL) 10 MG tablet   Anxiety state   Relevant Medications   PARoxetine (PAXIL) 10 MG tablet      Outpatient Encounter Medications as of 01/14/2019  Medication Sig  . [DISCONTINUED] PARoxetine (PAXIL) 10 MG tablet Take 10 mg by mouth daily.  Marland Kitchen PARoxetine (PAXIL) 10 MG tablet Take 1 tablet (10 mg total) by mouth daily.  . [DISCONTINUED] amoxicillin-clavulanate (AUGMENTIN) 875-125 MG tablet Take 1 tablet by mouth 2 (two) times daily.  . [DISCONTINUED] busPIRone (BUSPAR) 5 MG tablet Take 1 tablet (5 mg total) by mouth 3 (three) times daily.  . [DISCONTINUED] Chlorpheniramine-Phenylephrine 4-10 MG tablet Take 1 tablet by mouth every 4 (four) hours as needed for congestion.  . [DISCONTINUED] doxycycline (VIBRA-TABS) 100 MG tablet Take 1 tablet (100 mg total) by mouth 2 (two) times daily.  . [DISCONTINUED] gabapentin (NEURONTIN) 300 MG capsule Take 1 capsule (300 mg total) by mouth 3 (three) times daily.  . [DISCONTINUED] lisinopril (PRINIVIL,ZESTRIL) 10 MG tablet Take 1 tablet (10 mg total) by mouth daily.  . [DISCONTINUED] Oxycodone HCl 10 MG TABS Take 1 tablet (10 mg total) by mouth every 6 (six) hours as needed (pain).  . [DISCONTINUED] traZODone (DESYREL) 100 MG tablet Take 1 tablet (100 mg total) by mouth at bedtime as needed for sleep (please give at 9 PM).   No facility-administered encounter medications on file as of 01/14/2019.     Follow-up: Return in about 7 weeks (around 03/01/2019) for Hypertension.   Beatrice Lecher, MD

## 2019-01-14 NOTE — Patient Instructions (Signed)
Try to get your own blood pressure cuff if possible check it maybe 2 times a week and then after 2 weeks send me a note through my chart to let me know what your blood pressures are doing.

## 2019-01-14 NOTE — Assessment & Plan Note (Signed)
Pressure not well controlled.  Amlodipine added to intolerance list.  We will check his labs today if renal function is normal we will start lisinopril HCTZ 20/12.5.  We will send over to pharmacy.  If he has any problems he can let us know.  Encouraged him to get a home blood pressure cuff and check it on his own a couple times a week and send [place] a MyChart note in a couple weeks let us know how it is doing so that we can adjust his regimen if needed.  Explained that it can take up to 2 weeks for the blood pressure medication to reach full efficacy.

## 2019-01-14 NOTE — Assessment & Plan Note (Signed)
He just recently restarted his Paxil.  Explained that it can take 6 to 8 weeks to reach full efficacy.  It is a low dose and can always be adjusted if needed.

## 2019-01-15 ENCOUNTER — Encounter: Payer: Self-pay | Admitting: Family Medicine

## 2019-01-15 LAB — COMPLETE METABOLIC PANEL WITH GFR
AG Ratio: 2.1 (calc) (ref 1.0–2.5)
ALT: 45 U/L (ref 9–46)
AST: 32 U/L (ref 10–40)
Albumin: 4.6 g/dL (ref 3.6–5.1)
Alkaline phosphatase (APISO): 91 U/L (ref 36–130)
BUN: 13 mg/dL (ref 7–25)
CO2: 24 mmol/L (ref 20–32)
Calcium: 8.8 mg/dL (ref 8.6–10.3)
Chloride: 103 mmol/L (ref 98–110)
Creat: 1.1 mg/dL (ref 0.60–1.35)
GFR, Est African American: 91 mL/min/{1.73_m2} (ref >=60)
GFR, Est Non African American: 78 mL/min/{1.73_m2} (ref >=60)
Globulin: 2.2 g/dL (calc) (ref 1.9–3.7)
Glucose, Bld: 116 mg/dL — ABNORMAL HIGH (ref 65–99)
Potassium: 4.1 mmol/L (ref 3.5–5.3)
Sodium: 139 mmol/L (ref 135–146)
Total Bilirubin: 1 mg/dL (ref 0.2–1.2)
Total Protein: 6.8 g/dL (ref 6.1–8.1)

## 2019-01-15 LAB — CBC
HCT: 43.8 % (ref 38.5–50.0)
Hemoglobin: 15.1 g/dL (ref 13.2–17.1)
MCH: 31.5 pg (ref 27.0–33.0)
MCHC: 34.5 g/dL (ref 32.0–36.0)
MCV: 91.4 fL (ref 80.0–100.0)
MPV: 10.1 fL (ref 7.5–12.5)
Platelets: 218 10*3/uL (ref 140–400)
RBC: 4.79 10*6/uL (ref 4.20–5.80)
RDW: 12.8 % (ref 11.0–15.0)
WBC: 7.1 10*3/uL (ref 3.8–10.8)

## 2019-01-15 LAB — TSH: TSH: 1.69 mIU/L (ref 0.40–4.50)

## 2019-01-15 MED ORDER — LISINOPRIL-HYDROCHLOROTHIAZIDE 20-12.5 MG PO TABS: 1.0000 | ORAL_TABLET | Freq: Every day | ORAL | 0 refills | Status: DC

## 2019-01-15 NOTE — Addendum Note (Signed)
Addended by: Beatrice Lecher D on: 01/15/2019 10:06 AM   Modules accepted: Orders

## 2019-01-16 ENCOUNTER — Encounter: Payer: Self-pay | Admitting: Family Medicine

## 2019-01-18 ENCOUNTER — Encounter: Payer: Self-pay | Admitting: Family Medicine

## 2019-01-18 MED ORDER — BUSPIRONE HCL 5 MG PO TABS: 5.0000 mg | ORAL_TABLET | Freq: Two times a day (BID) | ORAL | 1 refills | Status: DC

## 2019-01-27 ENCOUNTER — Encounter: Payer: Self-pay | Admitting: Family Medicine

## 2019-01-28 NOTE — Telephone Encounter (Signed)
Note to PCP

## 2019-02-11 ENCOUNTER — Telehealth (INDEPENDENT_AMBULATORY_CARE_PROVIDER_SITE_OTHER): Payer: Self-pay | Admitting: Family Medicine

## 2019-02-11 ENCOUNTER — Encounter: Payer: Self-pay | Admitting: Family Medicine

## 2019-02-11 DIAGNOSIS — I10 Essential (primary) hypertension: Secondary | ICD-10-CM

## 2019-02-11 DIAGNOSIS — F411 Generalized anxiety disorder: Secondary | ICD-10-CM

## 2019-02-11 MED ORDER — LISINOPRIL 20 MG PO TABS: 20.0000 mg | ORAL_TABLET | Freq: Every day | ORAL | 0 refills | Status: DC

## 2019-02-11 NOTE — Assessment & Plan Note (Addendum)
Still not well controlled. Cut out caffeine and the hdyroxycut. Continue to work on diet and weight loss and salt loss. Will change to lisinopril 20 in AM and lisinopril hct 20/12.5 in the evening.  Plan to follow BP for a few more weeks. Consider adding metoprolol in 2 weeks if BP not better of stimulants/caffeine.

## 2019-02-11 NOTE — Assessment & Plan Note (Addendum)
Continue Buspar. He prefers to stop the paxil bc of the night sweats.  We can always try something else later if he would like.  He will let me know.

## 2019-02-11 NOTE — Progress Notes (Signed)
Established Patient Office Visit  Subjective:  Patient ID: James Fisher, male    DOB: 22-Nov-1970  Age: 48 y.o. MRN: [id number]  CC: No chief complaint on file.   HPI James Fisher presents for f/u BP.  He has been taking the lisinopril hctz BID and still getting BPs in the 140s/90s.   He has been trying to grill on the weekend. He is a long distance Administrator.  He has lost some weight and has been trying to eat better.  He does take hydroxycut daily and drinks a soda each day.  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  He would like to be able to take the lisinopril without the HCTZ in the morning and take the combo in the evening. He has been taking lisinopril hct 20/12.5 BID.    Has been getting sweats on the Paxil.  Having to change bed clothes. Skipped his paxil yesterday.  He is onl it for irritability. He says he really wants to work on it naturally.    Past Medical History:  Diagnosis Date  . Depression   . Gout   . Hypertension     No past surgical history on file.  Family History  Problem Relation Age of Onset  . Hypothyroidism Mother   . Hypertension Mother   . Atrial fibrillation Mother   . Kidney disease Mother     Social History   Socioeconomic History  . Marital status: Single    Spouse name: Not on file  . Number of children: Not on file  . Years of education: Not on file  . Highest education level: Not on file  Occupational History  . Not on file  Tobacco Use  . Smoking status: Never Smoker  . Smokeless tobacco: Never Used  Substance and Sexual Activity  . Alcohol use: Yes    Comment: 50-60 beers a week  . Drug use: Not Currently  . Sexual activity: Not on file  Other Topics Concern  . Not on file  Social History Narrative  . Not on file   Social Determinants of Health   Financial Resource Strain:   . Difficulty of Paying Living Expenses: Not on file  Food Insecurity:   . Worried About Charity fundraiser in the Last  Year: Not on file  . Ran Out of Food in the Last Year: Not on file  Transportation Needs:   . Lack of Transportation (Medical): Not on file  . Lack of Transportation (Non-Medical): Not on file  Physical Activity:   . Days of Exercise per Week: Not on file  . Minutes of Exercise per Session: Not on file  Stress:   . Feeling of Stress : Not on file  Social Connections:   . Frequency of Communication with Friends and Family: Not on file  . Frequency of Social Gatherings with Friends and Family: Not on file  . Attends Religious Services: Not on file  . Active Member of Clubs or Organizations: Not on file  . Attends Archivist Meetings: Not on file  . Marital Status: Not on file  Intimate Partner Violence:   . Fear of Current or Ex-Partner: Not on file  . Emotionally Abused: Not on file  . Physically Abused: Not on file  . Sexually Abused: Not on file    Outpatient Medications Prior to Visit  Medication Sig Dispense Refill  . busPIRone (BUSPAR) 5 MG tablet Take 1 tablet (5 mg  total) by mouth 2 (two) times daily. 60 tablet 1  . lisinopril-hydrochlorothiazide (ZESTORETIC) 20-12.5 MG tablet Take 1 tablet by mouth daily. 90 tablet 0  . PARoxetine (PAXIL) 10 MG tablet Take 1 tablet (10 mg total) by mouth daily. 90 tablet 0   No facility-administered medications prior to visit.    Allergies  Allergen Reactions  . Bee Venom Swelling  . Onion Hives  . Amlodipine Swelling    Leg swelling   . Mushroom Extract Complex Hives    ROS Review of Systems    Objective:    Physical Exam  Constitutional: He is oriented to person, place, and time. He appears well-developed and well-nourished.  HENT:  Head: Normocephalic and atraumatic.  Eyes: Conjunctivae and EOM are normal.  Pulmonary/Chest: Effort normal.  Neurological: He is alert and oriented to person, place, and time.  Skin: No pallor.  Psychiatric: He has a normal mood and affect. His behavior is normal.  Vitals  reviewed.   There were no vitals taken for this visit. Wt Readings from Last 3 Encounters:  01/14/19 214 lb (97.1 kg)  09/24/17 205 lb 3.2 oz (93.1 kg)  09/23/17 190 lb (86.2 kg)     There are no preventive care reminders to display for this patient.  There are no preventive care reminders to display for this patient.  Lab Results  Component Value Date   TSH 1.69 01/14/2019   Lab Results  Component Value Date   WBC 7.1 01/14/2019   HGB 15.1 01/14/2019   HCT 43.8 01/14/2019   MCV 91.4 01/14/2019   PLT 218 01/14/2019   Lab Results  Component Value Date   NA 139 01/14/2019   K 4.1 01/14/2019   CO2 24 01/14/2019   GLUCOSE 116 (H) 01/14/2019   BUN 13 01/14/2019   CREATININE 1.10 01/14/2019   BILITOT 1.0 01/14/2019   ALKPHOS 96 09/26/2017   AST 32 01/14/2019   ALT 45 01/14/2019   PROT 6.8 01/14/2019   ALBUMIN 3.4 (L) 09/26/2017   CALCIUM 8.8 01/14/2019   ANIONGAP 6 09/29/2017   No results found for: CHOL No results found for: HDL No results found for: LDLCALC No results found for: TRIG No results found for: CHOLHDL No results found for: QMGQ6P    Assessment & Plan:   Problem List Items Addressed This Visit      Cardiovascular and Mediastinum   Essential hypertension, benign - Primary    Still not well controlled. Cut out caffeine and the hdyroxycut. Continue to work on diet and weight loss and salt loss. Will change to lisinopril 20 in AM and lisinopril hct 20/12.5 in the evening.  Plan to follow BP for a few more weeks. Consider adding metoprolol in 2 weeks if BP not better of stimulants/caffeine.        Relevant Medications   lisinopril (ZESTRIL) 20 MG tablet     Other   Anxiety state    Continue Buspar. He prefers to stop the paxil bc of the night sweats.  We can always try something else later if he would like.  He will let me know.         Meds ordered this encounter  Medications  . lisinopril (ZESTRIL) 20 MG tablet    Sig: Take 1 tablet (20  mg total) by mouth daily.    Dispense:  90 tablet    Refill:  0    Follow-up: Return in about 6 weeks (around 03/25/2019) for Hypertension.   Time spent  25 min, > 50% spent counseling about Hypertension and anxiety/irritability.  Nani Gasseratherine Gisselle Galvis, MD

## 2019-02-15 ENCOUNTER — Encounter: Payer: Self-pay | Admitting: Family Medicine

## 2019-02-15 NOTE — Telephone Encounter (Signed)
I will handle the other message. Patient wants you to look at the supplement he is taking and I will have him make a follow up with you soon. FYI.

## 2019-02-17 NOTE — Telephone Encounter (Signed)
Please help to schedule appt as requested in his note.

## 2019-03-09 ENCOUNTER — Encounter: Payer: Self-pay | Admitting: Family Medicine

## 2019-03-10 ENCOUNTER — Encounter: Payer: Self-pay | Admitting: Family Medicine

## 2019-03-10 MED ORDER — INDOMETHACIN 50 MG PO CAPS: 50.0000 mg | ORAL_CAPSULE | Freq: Three times a day (TID) | ORAL | 2 refills | Status: DC

## 2019-03-10 NOTE — Telephone Encounter (Signed)
Patient is having gout pain on left foot. Patient will be home early tomorrow and wanted to know if this was going to be called in. States he is in a lot of pain. Was calling to get an update on this message.

## 2019-03-10 NOTE — Telephone Encounter (Signed)
Please see previous note.  Prescription sent to the pharmacy.  Patients have to understand that there is not an immediate turnaround on my chart notes.

## 2019-03-10 NOTE — Telephone Encounter (Signed)
Sent to [facility]

## 2019-03-10 NOTE — Telephone Encounter (Signed)
Patient called James Fisher and I let him know it was called in. Patient will pick it up first thing tomorrow morning. No further questions at this time.

## 2019-03-11 ENCOUNTER — Encounter: Payer: Self-pay | Admitting: Family Medicine

## 2019-03-22 ENCOUNTER — Encounter: Payer: Self-pay | Admitting: Family Medicine

## 2019-03-22 ENCOUNTER — Other Ambulatory Visit: Payer: Self-pay

## 2019-03-22 ENCOUNTER — Ambulatory Visit (INDEPENDENT_AMBULATORY_CARE_PROVIDER_SITE_OTHER): Payer: BC Managed Care – PPO | Admitting: Family Medicine

## 2019-03-22 VITALS — BP 126/71 | HR 78 | Ht 72.0 in | Wt 214.0 lb

## 2019-03-22 DIAGNOSIS — I10 Essential (primary) hypertension: Secondary | ICD-10-CM | POA: Diagnosis not present

## 2019-03-22 DIAGNOSIS — R1010 Upper abdominal pain, unspecified: Secondary | ICD-10-CM

## 2019-03-22 DIAGNOSIS — Z789 Other specified health status: Secondary | ICD-10-CM

## 2019-03-22 DIAGNOSIS — R7309 Other abnormal glucose: Secondary | ICD-10-CM | POA: Diagnosis not present

## 2019-03-22 DIAGNOSIS — M109 Gout, unspecified: Secondary | ICD-10-CM | POA: Insufficient documentation

## 2019-03-22 DIAGNOSIS — M1A0790 Idiopathic chronic gout, unspecified ankle and foot, without tophus (tophi): Secondary | ICD-10-CM

## 2019-03-22 DIAGNOSIS — M7711 Lateral epicondylitis, right elbow: Secondary | ICD-10-CM

## 2019-03-22 DIAGNOSIS — Z7289 Other problems related to lifestyle: Secondary | ICD-10-CM

## 2019-03-22 LAB — POCT GLYCOSYLATED HEMOGLOBIN (HGB A1C): Hemoglobin A1C: 5.5 % (ref 4.0–5.6)

## 2019-03-22 MED ORDER — DICLOFENAC SODIUM 1 % EX GEL: 2.0000 g | Freq: Four times a day (QID) | CUTANEOUS | 1 refills | Status: DC

## 2019-03-22 MED ORDER — METOPROLOL SUCCINATE ER 25 MG PO TB24: 25.0000 mg | ORAL_TABLET | Freq: Every day | ORAL | 2 refills | Status: DC

## 2019-03-22 NOTE — Assessment & Plan Note (Addendum)
Home blood pressure still just a little elevated running in the 130s over low 90s.  We discussed options.  We will add a low-dose of metoprolol 25 mg to the lisinopril HCT.  Aloe up in a couple months.  He has a home blood pressure cuff and will let me know what those show over the next couple of weeks.  Also encouraged cutting back on alcohol intake.

## 2019-03-22 NOTE — Assessment & Plan Note (Signed)
The medicine usually works really well for him.  Continue current regimen.  We will check a uric acid for baseline.  It sounds like he probably gets 2-3 flares per year.  We could certainly look at some dietary changes that might be helpful and/or allopurinol and discussed that with him today.

## 2019-03-22 NOTE — Progress Notes (Signed)
Established Patient Office Visit  Subjective:  Patient ID: James Fisher, male    DOB: March 14, 1970  Age: 49 y.o. MRN: [id number]  CC:  Chief Complaint  Patient presents with  . Hypertension  . abnormal glucose    HPI James Fisher presents for   Hypertension- Pt denies chest pain, SOB, dizziness, or heart palpitations.  Taking meds as directed w/o problems.  Denies medication side effects. Home BPs running in the 130/90s.    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  Depression/anxiety-he discontinued his buspirone and Paxil a while ago because it was causing him to feel sweaty.  Does not want to start anything different.  Abnormal glucose-on recent labs in November glucose was 116 so plan to do an A1c today just to rule out diabetes or prediabetes.  Follow-up gout -he had really simply contacted [place] through MyChart about a gout flare in his left foot.  He says he is the first flare was back in 2014 and he was diagnosed at the emergency department.  He says his last flare was back in August.  We had sent in a prescription for indomethacin he took it twice a day for 2 days and then was able to switch over to Aleve which really seem to help.  He also complains of right outer elbow pain that sometimes shoots all the way down to his hands and sometimes he feels a little numbness and tingling in that hand as well.  Its been going on for a while is not sure what triggered it no specific injury or trauma.  He has been taking a multivitamin probiotic and is trying to eat a little better as well.  Also reports daily alcohol use in particular beer since he was in his late teens.  He is just worried now that he is 48 over time it may have caused some damage to his liver he is never been diagnosed previously with any type of liver problems but is concerned.   Past Medical History:  Diagnosis Date  .  Depression   . Gout   . Hypertension     No past surgical history on file.  Family History  Problem Relation Age of Onset  . Hypothyroidism Mother   . Hypertension Mother   . Atrial fibrillation Mother   . Kidney disease Mother     Social History   Socioeconomic History  . Marital status: Single    Spouse name: Not on file  . Number of children: Not on file  . Years of education: Not on file  . Highest education level: Not on file  Occupational History  . Not on file  Tobacco Use  . Smoking status: Never Smoker  . Smokeless tobacco: Never Used  Substance and Sexual Activity  . Alcohol use: Yes    Comment: 50-60 beers a week  . Drug use: Not Currently  . Sexual activity: Not on file  Other Topics Concern  . Not on file  Social History Narrative  . Not on file   Social Determinants of Health   Financial Resource Strain:   . Difficulty of Paying Living Expenses: Not on file  Food Insecurity:   . Worried About Programme researcher, broadcasting/film/video in the Last Year: Not on file  . Ran Out of Food in the Last Year: Not on file  Transportation Needs:   . Lack  of Transportation (Medical): Not on file  . Lack of Transportation (Non-Medical): Not on file  Physical Activity:   . Days of Exercise per Week: Not on file  . Minutes of Exercise per Session: Not on file  Stress:   . Feeling of Stress : Not on file  Social Connections:   . Frequency of Communication with Friends and Family: Not on file  . Frequency of Social Gatherings with Friends and Family: Not on file  . Attends Religious Services: Not on file  . Active Member of Clubs or Organizations: Not on file  . Attends Banker Meetings: Not on file  . Marital Status: Not on file  Intimate Partner Violence:   . Fear of Current or Ex-Partner: Not on file  . Emotionally Abused: Not on file  . Physically Abused: Not on file  . Sexually Abused: Not on file    Outpatient Medications Prior to Visit  Medication Sig  Dispense Refill  . indomethacin (INDOCIN) 50 MG capsule Take 1 capsule (50 mg total) by mouth 3 (three) times daily with meals. 30 capsule 2  . lisinopril (ZESTRIL) 20 MG tablet Take 1 tablet (20 mg total) by mouth daily. 90 tablet 0  . lisinopril-hydrochlorothiazide (ZESTORETIC) 20-12.5 MG tablet Take 1 tablet by mouth daily. 90 tablet 0  . busPIRone (BUSPAR) 5 MG tablet Take 1 tablet (5 mg total) by mouth 2 (two) times daily. 60 tablet 1   No facility-administered medications prior to visit.    Allergies  Allergen Reactions  . Bee Venom Swelling  . Onion Hives  . Amlodipine Swelling    Leg swelling   . Buspar [Buspirone] Other (See Comments)    Caused sweating  . Mushroom Extract Complex Hives    ROS Review of Systems    Objective:    Physical Exam  Constitutional: He is oriented to person, place, and time. He appears well-developed and well-nourished.  HENT:  Head: Normocephalic and atraumatic.  Cardiovascular: Normal rate, regular rhythm and normal heart sounds.  Pulmonary/Chest: Effort normal and breath sounds normal.  Musculoskeletal:     Comments: And over the lateral epicondyle.  Also pain with supination against resistance.  Otherwise normal range of motion of the elbow wrist and fingers.  Negative Tinel's and Phalen sign.  Neurological: He is alert and oriented to person, place, and time.  Skin: Skin is warm and dry.  Psychiatric: He has a normal mood and affect. His behavior is normal.    BP 126/71   Pulse 78   Ht 6' (1.829 m)   Wt 214 lb (97.1 kg)   SpO2 99%   BMI 29.02 kg/m  Wt Readings from Last 3 Encounters:  03/22/19 214 lb (97.1 kg)  01/14/19 214 lb (97.1 kg)  09/24/17 205 lb 3.2 oz (93.1 kg)     There are no preventive care reminders to display for this patient.  There are no preventive care reminders to display for this patient.  Lab Results  Component Value Date   TSH 1.69 01/14/2019   Lab Results  Component Value Date   WBC 7.1  01/14/2019   HGB 15.1 01/14/2019   HCT 43.8 01/14/2019   MCV 91.4 01/14/2019   PLT 218 01/14/2019   Lab Results  Component Value Date   NA 139 01/14/2019   K 4.1 01/14/2019   CO2 24 01/14/2019   GLUCOSE 116 (H) 01/14/2019   BUN 13 01/14/2019   CREATININE 1.10 01/14/2019   BILITOT 1.0 01/14/2019  ALKPHOS 96 09/26/2017   AST 32 01/14/2019   ALT 45 01/14/2019   PROT 6.8 01/14/2019   ALBUMIN 3.4 (L) 09/26/2017   CALCIUM 8.8 01/14/2019   ANIONGAP 6 09/29/2017   No results found for: CHOL No results found for: HDL No results found for: LDLCALC No results found for: TRIG No results found for: CHOLHDL Lab Results  Component Value Date   HGBA1C 5.5 03/22/2019      Assessment & Plan:   Problem List Items Addressed This Visit      Cardiovascular and Mediastinum   Essential hypertension, benign - Primary    Home blood pressure still just a little elevated running in the 130s over low 90s.  We discussed options.  We will add a low-dose of metoprolol 25 mg to the lisinopril HCT.  Aloe up in a couple months.  He has a home blood pressure cuff and will let me know what those show over the next couple of weeks.  Also encouraged cutting back on alcohol intake.      Relevant Medications   metoprolol succinate (TOPROL-XL) 25 MG 24 hr tablet   Other Relevant Orders   Lipid Panel w/reflex Direct LDL   Uric acid   CBC   Hepatic function panel   BASIC METABOLIC PANEL WITH GFR   US ABDOMEN COMPLETE W/ELASTOGRAPHY     Musculoskeletal and Integument   Gout of foot    The medicine usually works really well for him.  Continue current regimen.  We will check a uric acid for baseline.  It sounds like he probably gets 2-3 flares per year.  We could certainly look at some dietary changes that might be helpful and/or allopurinol and discussed that with him today.      Relevant Orders   Lipid Panel w/reflex Direct LDL   Uric acid   CBC   Hepatic function panel   BASIC METABOLIC PANEL  WITH GFR   US ABDOMEN COMPLETE W/ELASTOGRAPHY    Other Visit Diagnoses    Abnormal glucose       Relevant Orders   POCT glycosylated hemoglobin (Hb A1C) (Completed)   Lipid Panel w/reflex Direct LDL   Uric acid   CBC   Hepatic function panel   BASIC METABOLIC PANEL WITH GFR   US ABDOMEN COMPLETE W/ELASTOGRAPHY   Upper abdominal pain       Relevant Orders   US ABDOMEN COMPLETE W/ELASTOGRAPHY   Lateral epicondylitis of right elbow       Relevant Medications   diclofenac Sodium (VOLTAREN) 1 % GEL   Alcohol use       Relevant Orders   US ABDOMEN COMPLETE W/ELASTOGRAPHY      Abnormal glucose-hemoglobin A1c is 5.5 which looks fantastic today.  So rules out diabetes or prediabetes.  Epicondylitis-recommended getting a elbow strap and given handout on exercises to do on his own at home.  Recommended topical anti-inflammatory such as Voltaren gel.  If not covered we can switch to an oral anti-inflammatory.  If not improving over the next few weeks to consider injection.  Try to avoid repetitive motions that can aggravate the symptoms.  Alcohol use, daily-Will schedule for ultrasound with elastography as well as hepatic function tests.  Did encourage him to cut back to no more than 2/day which is considered moderate intake.  Meds ordered this encounter  Medications  . metoprolol succinate (TOPROL-XL) 25 MG 24 hr tablet    Sig: Take 1 tablet (25 mg total) by mouth daily.  Dispense:  30 tablet    Refill:  2  . diclofenac Sodium (VOLTAREN) 1 % GEL    Sig: Apply 2 g topically 4 (four) times daily. To outer elbow    Dispense:  100 g    Refill:  1    Follow-up: Return in about 3 months (around 06/20/2019) for Hypertension.    Nani Gasser, MD

## 2019-03-23 DIAGNOSIS — I10 Essential (primary) hypertension: Secondary | ICD-10-CM | POA: Diagnosis not present

## 2019-03-23 DIAGNOSIS — R7309 Other abnormal glucose: Secondary | ICD-10-CM | POA: Diagnosis not present

## 2019-03-23 DIAGNOSIS — M1A0790 Idiopathic chronic gout, unspecified ankle and foot, without tophus (tophi): Secondary | ICD-10-CM | POA: Diagnosis not present

## 2019-03-24 ENCOUNTER — Encounter: Payer: Self-pay | Admitting: Family Medicine

## 2019-03-24 LAB — LIPID PANEL W/REFLEX DIRECT LDL
Cholesterol: 204 mg/dL — ABNORMAL HIGH (ref <200)
HDL: 46 mg/dL (ref >=40)
LDL Cholesterol (Calc): 121 mg/dL (calc) — ABNORMAL HIGH
Non-HDL Cholesterol (Calc): 158 mg/dL (calc) — ABNORMAL HIGH (ref <130)
Total CHOL/HDL Ratio: 4.4 (calc) (ref <5.0)
Triglycerides: 262 mg/dL — ABNORMAL HIGH (ref <150)

## 2019-03-24 LAB — HEPATIC FUNCTION PANEL
AG Ratio: 2.1 (calc) (ref 1.0–2.5)
ALT: 47 U/L — ABNORMAL HIGH (ref 9–46)
AST: 30 U/L (ref 10–40)
Albumin: 4.7 g/dL (ref 3.6–5.1)
Alkaline phosphatase (APISO): 86 U/L (ref 36–130)
Bilirubin, Direct: 0.2 mg/dL (ref 0.0–0.2)
Globulin: 2.2 g/dL (calc) (ref 1.9–3.7)
Indirect Bilirubin: 0.8 mg/dL (calc) (ref 0.2–1.2)
Total Bilirubin: 1 mg/dL (ref 0.2–1.2)
Total Protein: 6.9 g/dL (ref 6.1–8.1)

## 2019-03-24 LAB — URIC ACID: Uric Acid, Serum: 10.2 mg/dL — ABNORMAL HIGH (ref 4.0–8.0)

## 2019-03-24 LAB — CBC
HCT: 43.3 % (ref 38.5–50.0)
Hemoglobin: 15.2 g/dL (ref 13.2–17.1)
MCH: 32.3 pg (ref 27.0–33.0)
MCHC: 35.1 g/dL (ref 32.0–36.0)
MCV: 92.1 fL (ref 80.0–100.0)
MPV: 10.1 fL (ref 7.5–12.5)
Platelets: 217 10*3/uL (ref 140–400)
RBC: 4.7 10*6/uL (ref 4.20–5.80)
RDW: 13.3 % (ref 11.0–15.0)
WBC: 6.8 10*3/uL (ref 3.8–10.8)

## 2019-03-24 LAB — BASIC METABOLIC PANEL WITH GFR
BUN: 16 mg/dL (ref 7–25)
CO2: 27 mmol/L (ref 20–32)
Calcium: 9.7 mg/dL (ref 8.6–10.3)
Chloride: 102 mmol/L (ref 98–110)
Creat: 1.22 mg/dL (ref 0.60–1.35)
GFR, Est African American: 80 mL/min/{1.73_m2} (ref >=60)
GFR, Est Non African American: 69 mL/min/{1.73_m2} (ref >=60)
Glucose, Bld: 115 mg/dL — ABNORMAL HIGH (ref 65–99)
Potassium: 4.4 mmol/L (ref 3.5–5.3)
Sodium: 139 mmol/L (ref 135–146)

## 2019-03-26 ENCOUNTER — Other Ambulatory Visit: Payer: Self-pay | Admitting: Family Medicine

## 2019-03-26 ENCOUNTER — Encounter: Payer: Self-pay | Admitting: Family Medicine

## 2019-03-26 DIAGNOSIS — M1A0790 Idiopathic chronic gout, unspecified ankle and foot, without tophus (tophi): Secondary | ICD-10-CM

## 2019-03-26 MED ORDER — COLCHICINE 0.6 MG PO TABS: 0.6000 mg | ORAL_TABLET | Freq: Every day | ORAL | 1 refills | Status: DC

## 2019-03-26 MED ORDER — ALLOPURINOL 100 MG PO TABS: 100.0000 mg | ORAL_TABLET | Freq: Every day | ORAL | 1 refills | Status: DC

## 2019-03-31 ENCOUNTER — Ambulatory Visit (HOSPITAL_COMMUNITY): Payer: BC Managed Care – PPO

## 2019-04-01 ENCOUNTER — Encounter: Payer: Self-pay | Admitting: Family Medicine

## 2019-04-01 NOTE — Telephone Encounter (Signed)
Appointment has been made for tomorrow. Patient will call back if he needs to reschedule due to work limitations. No further questions at this time.

## 2019-04-02 ENCOUNTER — Encounter: Payer: Self-pay | Admitting: Sports Medicine

## 2019-04-02 ENCOUNTER — Other Ambulatory Visit: Payer: Self-pay

## 2019-04-02 ENCOUNTER — Ambulatory Visit (INDEPENDENT_AMBULATORY_CARE_PROVIDER_SITE_OTHER): Payer: BC Managed Care – PPO | Admitting: Sports Medicine

## 2019-04-02 DIAGNOSIS — M7711 Lateral epicondylitis, right elbow: Secondary | ICD-10-CM

## 2019-04-02 NOTE — Progress Notes (Signed)
    Procedures performed today:    Procedure: Real-time Ultrasound Guided injection of the right common extensor tendon origin Device: Samsung HS60  Verbal informed consent obtained.  Time-out conducted.  Noted no overlying erythema, induration, or other signs of local infection.  Skin prepped in a sterile fashion.  Local anesthesia: Topical Ethyl chloride.  With sterile technique and under real time ultrasound guidance: 1 cc Kenalog 40, 1 cc lidocaine, 1 cc bupivacaine injected easily Completed without difficulty  Pain immediately resolved suggesting accurate placement of the medication.  Advised to call if fevers/chills, erythema, induration, drainage, or persistent bleeding.  Images permanently stored and available for review in the ultrasound unit.  Impression: Technically successful ultrasound guided injection.  Independent interpretation of tests performed by another provider:   None.  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 He has failed NSAIDs, as well as a tennis elbow brace. Today we did a ultrasound-guided injection. Getting him rehab exercises, continue tennis elbow brace, return to see me in 1 month. Out of work for 1 week.    ___________________________________________ [name]. Benjamin Stain, M.D., ABFM., CAQSM. Primary Care and Sports Medicine [facility]  Adjunct Instructor of Family Medicine  [employer]

## 2019-04-02 NOTE — Assessment & Plan Note (Signed)
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 He has failed NSAIDs, as well as a tennis elbow brace. Today we did a ultrasound-guided injection. Getting him rehab exercises, continue tennis elbow brace, return to see me in 1 month. Out of work for 1 week.

## 2019-04-05 ENCOUNTER — Encounter: Payer: Self-pay | Admitting: Family Medicine

## 2019-04-05 ENCOUNTER — Other Ambulatory Visit: Payer: Self-pay

## 2019-04-05 ENCOUNTER — Ambulatory Visit (HOSPITAL_COMMUNITY)
Admission: RE | Admit: 2019-04-05 | Discharge: 2019-04-05 | Disposition: A | Discharge status: Home / Self Care | Payer: BC Managed Care – PPO | Source: Ambulatory Visit | Attending: Family Medicine | Admitting: Family Medicine

## 2019-04-05 DIAGNOSIS — M1A0790 Idiopathic chronic gout, unspecified ankle and foot, without tophus (tophi): Secondary | ICD-10-CM | POA: Diagnosis not present

## 2019-04-05 DIAGNOSIS — Z789 Other specified health status: Secondary | ICD-10-CM

## 2019-04-05 DIAGNOSIS — Z7289 Other problems related to lifestyle: Secondary | ICD-10-CM

## 2019-04-05 DIAGNOSIS — R1010 Upper abdominal pain, unspecified: Secondary | ICD-10-CM

## 2019-04-05 DIAGNOSIS — I10 Essential (primary) hypertension: Secondary | ICD-10-CM | POA: Insufficient documentation

## 2019-04-05 DIAGNOSIS — R7309 Other abnormal glucose: Secondary | ICD-10-CM | POA: Insufficient documentation

## 2019-04-05 NOTE — Telephone Encounter (Signed)
Pended but wanted your review. Please send if appropriate

## 2019-04-05 NOTE — Telephone Encounter (Signed)
See if he would rather change to 40mg  once a day instead of 20mg  BID

## 2019-04-07 ENCOUNTER — Other Ambulatory Visit: Payer: Self-pay | Admitting: Family Medicine

## 2019-04-07 DIAGNOSIS — I10 Essential (primary) hypertension: Secondary | ICD-10-CM

## 2019-04-08 MED ORDER — LISINOPRIL 20 MG PO TABS: 20.0000 mg | ORAL_TABLET | Freq: Every day | ORAL | 0 refills | Status: DC

## 2019-04-08 MED ORDER — LISINOPRIL 40 MG PO TABS: 40.0000 mg | ORAL_TABLET | Freq: Every day | ORAL | 0 refills | Status: DC

## 2019-04-08 NOTE — Telephone Encounter (Signed)
Spoke with patient and he is agreeable to change to 40 mg once daily. RX sent.

## 2019-04-09 ENCOUNTER — Encounter: Payer: Self-pay | Admitting: Family Medicine

## 2019-04-12 MED ORDER — METOPROLOL SUCCINATE ER 25 MG PO TB24: 50.0000 mg | ORAL_TABLET | Freq: Every day | ORAL | 0 refills | Status: DC

## 2019-04-26 ENCOUNTER — Ambulatory Visit (INDEPENDENT_AMBULATORY_CARE_PROVIDER_SITE_OTHER): Payer: BC Managed Care – PPO

## 2019-04-26 ENCOUNTER — Encounter: Payer: Self-pay | Admitting: Sports Medicine

## 2019-04-26 ENCOUNTER — Other Ambulatory Visit: Payer: Self-pay

## 2019-04-26 ENCOUNTER — Ambulatory Visit (INDEPENDENT_AMBULATORY_CARE_PROVIDER_SITE_OTHER): Payer: BC Managed Care – PPO | Admitting: Sports Medicine

## 2019-04-26 DIAGNOSIS — M542 Cervicalgia: Secondary | ICD-10-CM | POA: Diagnosis not present

## 2019-04-26 DIAGNOSIS — M7711 Lateral epicondylitis, right elbow: Secondary | ICD-10-CM | POA: Diagnosis not present

## 2019-04-26 DIAGNOSIS — M5412 Radiculopathy, cervical region: Secondary | ICD-10-CM

## 2019-04-26 MED ORDER — PREDNISONE 50 MG PO TABS: ORAL_TABLET | ORAL | 0 refills | Status: DC

## 2019-04-26 NOTE — Assessment & Plan Note (Signed)
Month ago I injected James Fisher's right common extensor tendon origin. 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 His pain is not completely recurrent but he still has some increasing discomfort, hopefully the treatment below will help this, he will continue his rehab exercises and continue his tennis elbow brace for now. Certainly if persistent discomfort we can try a second injection.

## 2019-04-26 NOTE — Assessment & Plan Note (Addendum)
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 Subsequently he developed pain radiating from his posterior shoulder on the right, down the right arm to the fourth and fifth fingers. He does not have a cubital tunnel Tinel's sign suggesting that this is radicular in origin. Adding x-rays, prednisone, neck rehab exercises. Out of work for 1 week.

## 2019-04-26 NOTE — Progress Notes (Signed)
    Procedures performed today:    None.  Independent interpretation of notes and tests performed by another provider:   None.  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 Subsequently he developed pain radiating from his posterior shoulder on the right, down the right arm to the fourth and fifth fingers. He does not have a cubital tunnel Tinel's sign suggesting that this is radicular in origin. Adding x-rays, prednisone, neck rehab exercises. Out of work for 1 week.  Lateral epicondylitis, right elbow Month ago I injected [name]'s right common extensor tendon origin. 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 His pain is not completely recurrent but he still has some increasing discomfort, hopefully the treatment below will help this, he will continue his rehab exercises and continue his tennis elbow brace for now. Certainly if persistent discomfort we can try a second injection.    ___________________________________________ [name]. Benjamin Stain, M.D., ABFM., CAQSM. Primary Care and Sports Medicine [facility]  Adjunct Instructor of Family Medicine  [employer]

## 2019-04-28 ENCOUNTER — Encounter: Payer: Self-pay | Admitting: Family Medicine

## 2019-04-28 MED ORDER — METOPROLOL SUCCINATE ER 100 MG PO TB24: 100.0000 mg | ORAL_TABLET | Freq: Every day | ORAL | 1 refills | Status: DC

## 2019-04-28 MED ORDER — LISINOPRIL-HYDROCHLOROTHIAZIDE 20-12.5 MG PO TABS: 1.0000 | ORAL_TABLET | Freq: Every day | ORAL | 1 refills | Status: DC

## 2019-04-29 ENCOUNTER — Encounter: Payer: Self-pay | Admitting: Family Medicine

## 2019-04-30 ENCOUNTER — Ambulatory Visit: Payer: BC Managed Care – PPO | Admitting: Sports Medicine

## 2019-05-02 ENCOUNTER — Encounter: Payer: Self-pay | Admitting: Family Medicine

## 2019-05-24 ENCOUNTER — Ambulatory Visit: Payer: BC Managed Care – PPO | Admitting: Sports Medicine

## 2019-06-21 ENCOUNTER — Ambulatory Visit: Payer: BC Managed Care – PPO | Admitting: Family Medicine

## 2019-08-12 ENCOUNTER — Encounter: Payer: Self-pay | Admitting: Family Medicine

## 2019-08-12 NOTE — Telephone Encounter (Signed)
Please call patient to schedule appointment with Dr. Linford Arnold.

## 2019-08-12 NOTE — Telephone Encounter (Signed)
Called patient and transferred the call to Triage.

## 2019-08-13 ENCOUNTER — Ambulatory Visit (INDEPENDENT_AMBULATORY_CARE_PROVIDER_SITE_OTHER): Payer: Self-pay | Admitting: Family Medicine

## 2019-08-13 ENCOUNTER — Encounter: Payer: Self-pay | Admitting: Family Medicine

## 2019-08-13 VITALS — BP 180/92 | HR 62 | Wt 218.0 lb

## 2019-08-13 DIAGNOSIS — I10 Essential (primary) hypertension: Secondary | ICD-10-CM

## 2019-08-13 MED ORDER — LOSARTAN POTASSIUM-HCTZ 100-25 MG PO TABS: 1.0000 | ORAL_TABLET | Freq: Every day | ORAL | 3 refills | Status: DC

## 2019-08-13 NOTE — Assessment & Plan Note (Signed)
Repeat blood pressure looks much better but still not at goal he showed me his blood pressure log from home and most of them are running in the 130s to 150s.  With 140s and 150s being more prominent.  We discussed adding back the hydrochlorothiazide that he was taking at one time.  It seemed to really control his blood pressure well but it made him feel a little bit tired.  For now we will add it back he would prefer to try the 25 mg that of the 12.5 mg.  And also switch his lisinopril to losartan as some people do have a little bit better response to an ARB versus an ACE inhibitor.  We will continue metoprolol.  He did not tolerate amlodipine well in the past because of swelling.  Like to see him back in about a month for nurse visit make sure blood pressure is at goal if he is having any problems he will give [place] call back.

## 2019-08-13 NOTE — Progress Notes (Signed)
Acute Office Visit  Subjective:    Patient ID: James Fisher, male    DOB: [birth year], 49 y.o.   MRN: [id number]  Chief Complaint  Patient presents with  . Hypertension    HPI Patient is in today for uncontrolled BP.  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  We have gotten some great blood pressures here in the office.  Past Medical History:  Diagnosis Date  . Depression   . Gout   . Hypertension     History reviewed. No pertinent surgical history.  Family History  Problem Relation Age of Onset  . Hypothyroidism Mother   . Hypertension Mother   . Atrial fibrillation Mother   . Kidney disease Mother     Social History   Socioeconomic History  . Marital status: Single    Spouse name: Not on file  . Number of children: Not on file  . Years of education: Not on file  . Highest education level: Not on file  Occupational History  . Not on file  Tobacco Use  . Smoking status: Never Smoker  . Smokeless tobacco: Never Used  Substance and Sexual Activity  . Alcohol use: Yes    Comment: 50-60 beers a week  . Drug use: Not Currently  . Sexual activity: Not on file  Other Topics Concern  . Not on file  Social History Narrative  . Not on file   Social Determinants of Health   Financial Resource Strain:   . Difficulty of Paying Living Expenses:   Food Insecurity:   . Worried About Charity fundraiser in the Last Year:   . Arboriculturist in the Last Year:   Transportation Needs:   . Film/video editor (Medical):   Marland Kitchen Lack of Transportation (Non-Medical):   Physical Activity:   . Days of Exercise per Week:   . Minutes of Exercise per Session:   Stress:   . Feeling of Stress :   Social Connections:   . Frequency of Communication with Friends and Family:   . Frequency of Social  Gatherings with Friends and Family:   . Attends Religious Services:   . Active Member of Clubs or Organizations:   . Attends Archivist Meetings:   Marland Kitchen Marital Status:   Intimate Partner Violence:   . Fear of Current or Ex-Partner:   . Emotionally Abused:   Marland Kitchen Physically Abused:   . Sexually Abused:     Outpatient Medications Prior to Visit  Medication Sig Dispense Refill  . allopurinol (ZYLOPRIM) 100 MG tablet Take 1 tablet (100 mg total) by mouth daily. 90 tablet 1  . colchicine 0.6 MG tablet Take 1 tablet (0.6 mg total) by mouth daily. 90 tablet 1  . diclofenac Sodium (VOLTAREN) 1 % GEL Apply 2 g topically 4 (four) times daily. To outer elbow 100 g 1  . indomethacin (INDOCIN) 50 MG capsule Take 1 capsule (50 mg total) by mouth 3 (three) times daily with meals. 30 capsule 2  . metoprolol succinate (TOPROL-XL) 100 MG 24 hr tablet Take 1 tablet (100 mg total) by mouth daily. 90 tablet 1  . lisinopril (ZESTRIL) 40 MG tablet Take 1 tablet (40 mg total) by mouth daily. 90 tablet 0  . predniSONE (DELTASONE) 50 MG tablet  One tab PO daily for 5 days. 5 tablet 0   No facility-administered medications prior to visit.    Allergies  Allergen Reactions  . Bee Venom Swelling  . Onion Hives  . Amlodipine Swelling    Leg swelling   . Buspar [Buspirone] Other (See Comments)    Caused sweating  . Mushroom Extract Complex Hives    Review of Systems     Objective:    Physical Exam Constitutional:      Appearance: He is well-developed.  HENT:     Head: Normocephalic and atraumatic.  Cardiovascular:     Rate and Rhythm: Normal rate and regular rhythm.     Heart sounds: Normal heart sounds.  Pulmonary:     Effort: Pulmonary effort is normal.     Breath sounds: Normal breath sounds.  Skin:    General: Skin is warm and dry.  Neurological:     Mental Status: He is alert and oriented to person, place, and time.  Psychiatric:        Behavior: Behavior normal.     BP (!)  180/92   Pulse 62   Wt 218 lb (98.9 kg)   SpO2 100%   BMI 29.57 kg/m  Wt Readings from Last 3 Encounters:  08/13/19 218 lb (98.9 kg)  04/02/19 214 lb (97.1 kg)  03/22/19 214 lb (97.1 kg)    Health Maintenance Due  Topic Date Due  . Hepatitis C Screening  Never done    There are no preventive care reminders to display for this patient.   Lab Results  Component Value Date   TSH 1.69 01/14/2019   Lab Results  Component Value Date   WBC 6.8 03/23/2019   HGB 15.2 03/23/2019   HCT 43.3 03/23/2019   MCV 92.1 03/23/2019   PLT 217 03/23/2019   Lab Results  Component Value Date   NA 139 03/23/2019   K 4.4 03/23/2019   CO2 27 03/23/2019   GLUCOSE 115 (H) 03/23/2019   BUN 16 03/23/2019   CREATININE 1.22 03/23/2019   BILITOT 1.0 03/23/2019   ALKPHOS 96 09/26/2017   AST 30 03/23/2019   ALT 47 (H) 03/23/2019   PROT 6.9 03/23/2019   ALBUMIN 3.4 (L) 09/26/2017   CALCIUM 9.7 03/23/2019   ANIONGAP 6 09/29/2017   Lab Results  Component Value Date   CHOL 204 (H) 03/23/2019   Lab Results  Component Value Date   HDL 46 03/23/2019   Lab Results  Component Value Date   LDLCALC 121 (H) 03/23/2019   Lab Results  Component Value Date   TRIG 262 (H) 03/23/2019   Lab Results  Component Value Date   CHOLHDL 4.4 03/23/2019   Lab Results  Component Value Date   HGBA1C 5.5 03/22/2019       Assessment & Plan:   Problem List Items Addressed This Visit      Cardiovascular and Mediastinum   Essential hypertension, benign - Primary    Repeat blood pressure looks much better but still not at goal he showed me his blood pressure log from home and most of them are running in the 130s to 150s.  With 140s and 150s being more prominent.  We discussed adding back the hydrochlorothiazide that he was taking at one time.  It seemed to really control his blood pressure well but it made him feel a little bit tired.  For now we will add it back he would prefer to try the 25 mg that of  the 12.5 mg.  And also switch his lisinopril to losartan as some people do have a little bit better response to an ARB versus an ACE inhibitor.  We will continue metoprolol.  He did not tolerate amlodipine well in the past because of swelling.  Like to see him back in about a month for nurse visit make sure blood pressure is at goal if he is having any problems he will give [place] call back.      Relevant Medications   losartan-hydrochlorothiazide (HYZAAR) 100-25 MG tablet       Meds ordered this encounter  Medications  . losartan-hydrochlorothiazide (HYZAAR) 100-25 MG tablet    Sig: Take 1 tablet by mouth daily.    Dispense:  30 tablet    Refill:  3     Nani Gasser, MD

## 2019-08-13 NOTE — Telephone Encounter (Signed)
Patient scheduled for today

## 2019-08-18 ENCOUNTER — Encounter: Payer: Self-pay | Admitting: Family Medicine

## 2019-10-08 ENCOUNTER — Other Ambulatory Visit: Payer: Self-pay | Admitting: Family Medicine

## 2019-10-08 ENCOUNTER — Encounter: Payer: Self-pay | Admitting: Family Medicine

## 2019-10-08 DIAGNOSIS — M1A0790 Idiopathic chronic gout, unspecified ankle and foot, without tophus (tophi): Secondary | ICD-10-CM

## 2019-11-11 ENCOUNTER — Encounter: Payer: Self-pay | Admitting: Family Medicine

## 2019-11-12 ENCOUNTER — Other Ambulatory Visit: Payer: Self-pay | Admitting: *Deleted

## 2019-11-12 DIAGNOSIS — I10 Essential (primary) hypertension: Secondary | ICD-10-CM

## 2019-11-12 MED ORDER — METOPROLOL SUCCINATE ER 100 MG PO TB24: 100.0000 mg | ORAL_TABLET | Freq: Every day | ORAL | 1 refills | Status: DC

## 2019-11-12 MED ORDER — LOSARTAN POTASSIUM-HCTZ 100-25 MG PO TABS: 1.0000 | ORAL_TABLET | Freq: Every day | ORAL | 1 refills | Status: DC

## 2019-11-12 NOTE — Telephone Encounter (Signed)
James Fisher - pt is requesting med refills.   Dr. Linford Arnold - pt sent info regarding his alcohol intake, diet and an update on his blood pressure.

## 2019-12-16 ENCOUNTER — Encounter: Payer: Self-pay | Admitting: Family Medicine

## 2019-12-28 ENCOUNTER — Ambulatory Visit: Payer: Self-pay | Admitting: Family Medicine

## 2020-01-04 ENCOUNTER — Encounter: Payer: Self-pay | Admitting: Family Medicine

## 2020-01-05 ENCOUNTER — Other Ambulatory Visit: Payer: Self-pay | Admitting: *Deleted

## 2020-01-05 DIAGNOSIS — M1A0790 Idiopathic chronic gout, unspecified ankle and foot, without tophus (tophi): Secondary | ICD-10-CM

## 2020-01-05 MED ORDER — ALLOPURINOL 100 MG PO TABS: 100.0000 mg | ORAL_TABLET | Freq: Every day | ORAL | 0 refills | Status: DC

## 2020-03-30 ENCOUNTER — Encounter: Payer: Self-pay | Admitting: Family Medicine

## 2020-04-18 ENCOUNTER — Other Ambulatory Visit: Payer: Self-pay | Admitting: Family Medicine

## 2020-04-18 DIAGNOSIS — M1A0790 Idiopathic chronic gout, unspecified ankle and foot, without tophus (tophi): Secondary | ICD-10-CM

## 2020-04-20 MED ORDER — ALLOPURINOL 100 MG PO TABS: 100.0000 mg | ORAL_TABLET | Freq: Every day | ORAL | 0 refills | Status: DC

## 2020-05-09 ENCOUNTER — Encounter: Payer: Self-pay | Admitting: Family Medicine

## 2020-05-12 ENCOUNTER — Other Ambulatory Visit: Payer: Self-pay

## 2020-05-12 ENCOUNTER — Ambulatory Visit (INDEPENDENT_AMBULATORY_CARE_PROVIDER_SITE_OTHER): Payer: BC Managed Care – PPO | Admitting: Family Medicine

## 2020-05-12 ENCOUNTER — Encounter: Payer: Self-pay | Admitting: Family Medicine

## 2020-05-12 VITALS — BP 134/82 | HR 58 | Ht 72.0 in | Wt 198.0 lb

## 2020-05-12 DIAGNOSIS — F649 Gender identity disorder, unspecified: Secondary | ICD-10-CM

## 2020-05-12 DIAGNOSIS — I10 Essential (primary) hypertension: Secondary | ICD-10-CM

## 2020-05-12 MED ORDER — LOSARTAN POTASSIUM-HCTZ 100-25 MG PO TABS: 1.0000 | ORAL_TABLET | Freq: Every day | ORAL | 1 refills | Status: DC

## 2020-05-12 MED ORDER — METOPROLOL SUCCINATE ER 100 MG PO TB24: 100.0000 mg | ORAL_TABLET | Freq: Every day | ORAL | 1 refills | Status: DC

## 2020-05-12 NOTE — Progress Notes (Signed)
Established Patient Office Visit  Subjective:  Patient ID: James Fisher, male    DOB: 03-10-1970  Age: 50 y.o. MRN: [id number]  CC:  Chief Complaint  Patient presents with  . Follow-up    HPI Arturo Sofranko presents to discuss wanting to transition to male with the help of hormones.  He says as he has gotten older he just wants to feel comfortable as himself.  Prefer he pronoun for now.   Hypertension- Pt denies chest pain, SOB, dizziness, or heart palpitations.  Taking meds as directed w/o problems.  Denies medication side effects.  Has been really making changes. Cut back on alcohol. Maybe 3 drinks per week instead of daily.  Has been eating healthy and has lost some weight.     Past Medical History:  Diagnosis Date  . Depression   . Gout   . Hypertension     No past surgical history on file.  Family History  Problem Relation Age of Onset  . Hypothyroidism Mother   . Hypertension Mother   . Atrial fibrillation Mother   . Kidney disease Mother     Social History   Socioeconomic History  . Marital status: Single    Spouse name: Not on file  . Number of children: Not on file  . Years of education: Not on file  . Highest education level: Not on file  Occupational History  . Not on file  Tobacco Use  . Smoking status: Never Smoker  . Smokeless tobacco: Never Used  Substance and Sexual Activity  . Alcohol use: Yes    Comment: 50-60 beers a week  . Drug use: Not Currently  . Sexual activity: Not on file  Other Topics Concern  . Not on file  Social History Narrative  . Not on file   Social Determinants of Health   Financial Resource Strain: Not on file  Food Insecurity: Not on file  Transportation Needs: Not on file  Physical Activity: Not on file  Stress: Not on file  Social Connections: Not on file  Intimate Partner Violence: Not on file    Outpatient Medications Prior to Visit  Medication Sig Dispense Refill  . allopurinol (ZYLOPRIM) 100 MG tablet  Take 1 tablet (100 mg total) by mouth daily. 90 tablet 0  . diclofenac Sodium (VOLTAREN) 1 % GEL Apply 2 g topically 4 (four) times daily. To outer elbow 100 g 1  . indomethacin (INDOCIN) 50 MG capsule Take 1 capsule (50 mg total) by mouth 3 (three) times daily with meals. 30 capsule 2  . colchicine 0.6 MG tablet Take 1 tablet (0.6 mg total) by mouth daily. 90 tablet 1  . losartan-hydrochlorothiazide (HYZAAR) 100-25 MG tablet Take 1 tablet by mouth daily. 91 tablet 1  . metoprolol succinate (TOPROL-XL) 100 MG 24 hr tablet Take 1 tablet (100 mg total) by mouth daily. 90 tablet 1   No facility-administered medications prior to visit.    Allergies  Allergen Reactions  . Bee Venom Swelling  . Onion Hives  . Amlodipine Swelling    Leg swelling   . Buspar [Buspirone] Other (See Comments)    Caused sweating  . Mushroom Extract Complex Hives    ROS Review of Systems    Objective:    Physical Exam Vitals reviewed.  Constitutional:      Appearance: He is well-developed.  HENT:     Head: Normocephalic and atraumatic.  Eyes:     Conjunctiva/sclera: Conjunctivae normal.  Cardiovascular:  Rate and Rhythm: Normal rate.  Pulmonary:     Effort: Pulmonary effort is normal.  Skin:    General: Skin is dry.     Coloration: Skin is not pale.  Neurological:     Mental Status: He is alert and oriented to person, place, and time.  Psychiatric:        Behavior: Behavior normal.     BP 134/82   Pulse (!) 58   Ht 6' (1.829 m)   Wt 198 lb (89.8 kg)   SpO2 100%   BMI 26.85 kg/m  Wt Readings from Last 3 Encounters:  05/12/20 198 lb (89.8 kg)  08/13/19 218 lb (98.9 kg)  04/02/19 214 lb (97.1 kg)     Health Maintenance Due  Topic Date Due  . Hepatitis C Screening  Never done  . TETANUS/TDAP  Never done  . COLONOSCOPY (Pts 45-23yrs Insurance coverage will need to be confirmed)  Never done  . INFLUENZA VACCINE  Never done  . COVID-19 Vaccine (3 - Booster for Pfizer series)  04/28/2020    There are no preventive care reminders to display for this patient.  Lab Results  Component Value Date   TSH 1.69 01/14/2019   Lab Results  Component Value Date   WBC 6.8 03/23/2019   HGB 15.2 03/23/2019   HCT 43.3 03/23/2019   MCV 92.1 03/23/2019   PLT 217 03/23/2019   Lab Results  Component Value Date   NA 139 03/23/2019   K 4.4 03/23/2019   CO2 27 03/23/2019   GLUCOSE 115 (H) 03/23/2019   BUN 16 03/23/2019   CREATININE 1.22 03/23/2019   BILITOT 1.0 03/23/2019   ALKPHOS 96 09/26/2017   AST 30 03/23/2019   ALT 47 (H) 03/23/2019   PROT 6.9 03/23/2019   ALBUMIN 3.4 (L) 09/26/2017   CALCIUM 9.7 03/23/2019   ANIONGAP 6 09/29/2017   Lab Results  Component Value Date   CHOL 204 (H) 03/23/2019   Lab Results  Component Value Date   HDL 46 03/23/2019   Lab Results  Component Value Date   LDLCALC 121 (H) 03/23/2019   Lab Results  Component Value Date   TRIG 262 (H) 03/23/2019   Lab Results  Component Value Date   CHOLHDL 4.4 03/23/2019   Lab Results  Component Value Date   HGBA1C 5.5 03/22/2019      Assessment & Plan:   Problem List Items Addressed This Visit      Cardiovascular and Mediastinum   Essential hypertension, benign    .BP at goal today. Great changes in lifestyle. RF sent to pharmacy.      Relevant Medications   losartan-hydrochlorothiazide (HYZAAR) 100-25 MG tablet   metoprolol succinate (TOPROL-XL) 100 MG 24 hr tablet     Other   Gender dysphoria - Primary    Discussed referral to [facility] counseling for formal evaluation. Also given information about hormone replacement. At f/u will check Testosterone, Estradiol. CMP, PTH, etc      Relevant Orders   Ambulatory referral to Behavioral Health      Meds ordered this encounter  Medications  . losartan-hydrochlorothiazide (HYZAAR) 100-25 MG tablet    Sig: Take 1 tablet by mouth daily.    Dispense:  91 tablet    Refill:  1  . metoprolol succinate (TOPROL-XL) 100  MG 24 hr tablet    Sig: Take 1 tablet (100 mg total) by mouth daily.    Dispense:  90 tablet    Refill:  1    Follow-up: No follow-ups on file.    Nani Gasser, MD

## 2020-05-12 NOTE — Patient Instructions (Signed)
[facility] to schedule an appt [phone]

## 2020-05-12 NOTE — Assessment & Plan Note (Signed)
Discussed referral to [facility] counseling for formal evaluation. Also given information about hormone replacement. At f/u will check Testosterone, Estradiol. CMP, PTH, etc

## 2020-05-12 NOTE — Assessment & Plan Note (Signed)
.  BP at goal today. Great changes in lifestyle. RF sent to pharmacy.

## 2020-05-13 ENCOUNTER — Other Ambulatory Visit: Payer: Self-pay | Admitting: Family Medicine

## 2020-05-13 DIAGNOSIS — M1A0790 Idiopathic chronic gout, unspecified ankle and foot, without tophus (tophi): Secondary | ICD-10-CM

## 2020-05-14 ENCOUNTER — Encounter: Payer: Self-pay | Admitting: Family Medicine

## 2020-05-17 MED ORDER — ALLOPURINOL 100 MG PO TABS: 100.0000 mg | ORAL_TABLET | Freq: Every day | ORAL | 0 refills | Status: DC

## 2020-05-28 ENCOUNTER — Encounter: Payer: Self-pay | Admitting: Family Medicine

## 2020-06-22 ENCOUNTER — Emergency Department (INDEPENDENT_AMBULATORY_CARE_PROVIDER_SITE_OTHER)
Admission: EM | Admit: 2020-06-22 | Discharge: 2020-06-22 | Disposition: A | Discharge status: Home / Self Care | Payer: BC Managed Care – PPO | Source: Home / Self Care | Attending: Family Medicine | Admitting: Family Medicine

## 2020-06-22 ENCOUNTER — Other Ambulatory Visit: Payer: Self-pay

## 2020-06-22 ENCOUNTER — Emergency Department (INDEPENDENT_AMBULATORY_CARE_PROVIDER_SITE_OTHER): Payer: BC Managed Care – PPO

## 2020-06-22 DIAGNOSIS — M25562 Pain in left knee: Secondary | ICD-10-CM | POA: Diagnosis not present

## 2020-06-22 DIAGNOSIS — M25462 Effusion, left knee: Secondary | ICD-10-CM | POA: Diagnosis not present

## 2020-06-22 DIAGNOSIS — S8002XA Contusion of left knee, initial encounter: Secondary | ICD-10-CM | POA: Diagnosis not present

## 2020-06-22 MED ORDER — IBUPROFEN 800 MG PO TABS: 800.0000 mg | ORAL_TABLET | Freq: Three times a day (TID) | ORAL | 0 refills | Status: DC

## 2020-06-22 MED ORDER — OXYCODONE-ACETAMINOPHEN 5-325 MG PO TABS: 1.0000 | ORAL_TABLET | Freq: Three times a day (TID) | ORAL | 0 refills | Status: AC | PRN

## 2020-06-22 MED ORDER — OXYCODONE-ACETAMINOPHEN 5-325 MG PO TABS
1.0000 | ORAL_TABLET | Freq: Three times a day (TID) | ORAL | Status: DC | PRN
Start: 2020-06-22 — End: 2020-06-22

## 2020-06-22 NOTE — ED Provider Notes (Signed)
[facility]    CSN: [id number] Arrival date & time: 06/22/20  1344      History   Chief Complaint Chief Complaint  Patient presents with  . Knee Injury    Left    HPI James Fisher is a 50 y.o. male.   HPI pleasant 50 year old male presents with left knee pain for 1 day.  Reports while exiting his car last night around 5:30 PM he was taking his cooler off passenger side floorboard to remove from car he attempted to close car door with upper arm elbow and inadvertently/accidentally closed door onto left knee.  Reports immediately experienced pain on top of left knee and reports taking 6 OTC Aleve this morning prior to this office visit.  Additionally, patient reports purchasing left knee brace yesterday and removed it due to swelling of left knee.  Past Medical History:  Diagnosis Date  . Depression   . Gout   . Hypertension     Patient Active Problem List   Diagnosis Date Noted  . Gender dysphoria 05/12/2020  . Radiculitis of right cervical region 04/26/2019  . Lateral epicondylitis, right elbow 04/02/2019  . Gout of foot 03/22/2019  . Acute intractable headache   . Anxiety state 01/17/2009  . Essential hypertension, benign 01/17/2009    History reviewed. No pertinent surgical history.     Home Medications    Prior to Admission medications   Medication Sig Start Date End Date Taking? Authorizing Provider  ibuprofen (ADVIL) 800 MG tablet Take 1 tablet (800 mg total) by mouth 3 (three) times daily. 06/22/20  Yes Trevor Iha, FNP  oxyCODONE-acetaminophen (PERCOCET/ROXICET) 5-325 MG tablet Take 1 tablet by mouth every 8 (eight) hours as needed for up to 5 days for severe pain. 06/22/20 06/27/20 Yes Trevor Iha, FNP  allopurinol (ZYLOPRIM) 100 MG tablet Take 1 tablet (100 mg total) by mouth daily. 05/17/20   Agapito Games, MD  diclofenac Sodium (VOLTAREN) 1 % GEL Apply 2 g topically 4 (four) times daily. To outer elbow 03/22/19   Agapito Games,  MD  indomethacin (INDOCIN) 50 MG capsule Take 1 capsule (50 mg total) by mouth 3 (three) times daily with meals. 03/10/19   Agapito Games, MD  losartan-hydrochlorothiazide (HYZAAR) 100-25 MG tablet Take 1 tablet by mouth daily. 05/12/20   Agapito Games, MD  metoprolol succinate (TOPROL-XL) 100 MG 24 hr tablet Take 1 tablet (100 mg total) by mouth daily. 05/12/20   Agapito Games, MD    Family History Family History  Problem Relation Age of Onset  . Hypothyroidism Mother   . Hypertension Mother   . Atrial fibrillation Mother   . Kidney disease Mother     Social History Social History   Tobacco Use  . Smoking status: Never Smoker  . Smokeless tobacco: Never Used  Substance Use Topics  . Alcohol use: Yes    Comment: 50-60 beers a week  . Drug use: Not Currently     Allergies   Bee venom, Onion, Amlodipine, Buspar [buspirone], and Mushroom extract complex   Review of Systems Review of Systems  Please see HPI Physical Exam Triage Vital Signs ED Triage Vitals  Enc Vitals Group     BP 06/22/20 1411 (!) 148/78     Pulse Rate 06/22/20 1411 61     Resp 06/22/20 1411 20     Temp 06/22/20 1411 98.6 F (37 C)     Temp Source 06/22/20 1411 Oral     SpO2  06/22/20 1411 98 %     Weight 06/22/20 1412 195 lb (88.5 kg)     Height 06/22/20 1412 6' (1.829 m)     Head Circumference --      Peak Flow --      Pain Score 06/22/20 1412 10     Pain Loc --      Pain Edu? --      Excl. in GC? --    No data found.  Updated Vital Signs BP (!) 148/78 (BP Location: Left Arm)   Pulse 61   Temp 98.6 F (37 C) (Oral)   Resp 20   Ht 6' (1.829 m)   Wt 195 lb (88.5 kg)   SpO2 98%   BMI 26.45 kg/m   Physical Exam Vitals and nursing note reviewed.  Constitutional:      Appearance: Normal appearance. He is normal weight.  HENT:     Head: Normocephalic and atraumatic.  Eyes:     Conjunctiva/sclera: Conjunctivae normal.     Pupils: Pupils are equal, round, and  reactive to light.  Cardiovascular:     Rate and Rhythm: Normal rate and regular rhythm.     Pulses: Normal pulses.     Heart sounds: Normal heart sounds.  Pulmonary:     Effort: Pulmonary effort is normal.     Breath sounds: Normal breath sounds.  Musculoskeletal:       Legs:     Comments: Moderate TTP, with moderate soft tissue swelling noted, limited exam d/t pain, unable to extend left knee, moderate pain elicited when bearing weight /walking.  Minimal bruising noted.  Skin:    General: Skin is warm and dry.  Neurological:     General: No focal deficit present.     Mental Status: He is alert and oriented to person, place, and time.  Psychiatric:        Mood and Affect: Mood normal.        Behavior: Behavior normal.      UC Treatments / Results  Labs (all labs ordered are listed, but only abnormal results are displayed) Labs Reviewed - No data to display  EKG   Radiology DG Knee Complete 4 Views Left  Result Date: 06/22/2020 CLINICAL DATA:  Pain EXAM: LEFT KNEE - COMPLETE 4+ VIEW COMPARISON:  None. FINDINGS: Alignment is anatomic. No acute fracture. A joint effusion is present. Joint spaces are preserved. IMPRESSION: No acute osseous abnormality.  Joint effusion. Electronically Signed   By: Guadlupe Spanish M.D.   On: 06/22/2020 14:59    Procedures Procedures (including critical care time)  Medications Ordered in UC Medications  oxyCODONE-acetaminophen (PERCOCET/ROXICET) 5-325 MG per tablet 1 tablet (has no administration in time range)    Initial Impression / Assessment and Plan / UC Course  I have reviewed the triage vital signs and the nursing notes.  Pertinent labs & imaging results that were available during my care of the patient were reviewed by me and considered in my medical decision making (see chart for details). Patient has his own neoprene, bilateral hinged left knee brace.  Left knee brace is fitting patient correctly and was discharged in this  brace.   1.  Left knee pain, 2.  Left knee contusion Final Clinical Impressions(s) / UC Diagnoses   Final diagnoses:  Acute pain of left knee  Contusion of left knee, initial encounter     Discharge Instructions     Advised/encourage patient to RICE left knee for 20 minutes 3 times  daily for the next 5 days.  Advised to use Ibuprofen 800 mg 2-3 times daily and may use Oxycodone 5 mg/Acetaminophen 325 mg for breakthrough pain only.  Instructed patient to follow-up with his Orthopedic provider Otila Back, MD) for further evaluation this week or early next week.    ED Prescriptions    Medication Sig Dispense Auth. Provider   oxyCODONE-acetaminophen (PERCOCET/ROXICET) 5-325 MG tablet Take 1 tablet by mouth every 8 (eight) hours as needed for up to 5 days for severe pain. 15 tablet Trevor Iha, FNP   ibuprofen (ADVIL) 800 MG tablet Take 1 tablet (800 mg total) by mouth 3 (three) times daily. 21 tablet Trevor Iha, FNP     I have reviewed the PDMP during this encounter.   Trevor Iha, FNP 06/22/20 1544

## 2020-06-22 NOTE — Discharge Instructions (Addendum)
Advised/encourage patient to RICE left knee for 20 minutes 3 times daily for the next 5 days.  Advised to use Ibuprofen 800 mg 2-3 times daily and may use Oxycodone 5 mg/Acetaminophen 325 mg for breakthrough pain only.  Instructed patient to follow-up with his Orthopedic provider Otila Back, MD) for further evaluation this week or early next week.

## 2020-06-22 NOTE — ED Triage Notes (Signed)
Patient presents to Urgent Care after an injury to his left knee. His car door closed on his knee when he was exiting the car. Patient reports taking aleeve for pain but he is experiencing severe pain and swelling. Reports hearing a crunching and grinding when abducting his leg.

## 2020-07-21 ENCOUNTER — Ambulatory Visit: Payer: BC Managed Care – PPO | Admitting: Family Medicine

## 2020-08-10 ENCOUNTER — Other Ambulatory Visit: Payer: Self-pay | Admitting: Family Medicine

## 2020-08-10 DIAGNOSIS — M1A0790 Idiopathic chronic gout, unspecified ankle and foot, without tophus (tophi): Secondary | ICD-10-CM

## 2020-09-29 DIAGNOSIS — Z20822 Contact with and (suspected) exposure to covid-19: Secondary | ICD-10-CM | POA: Diagnosis not present

## 2020-10-19 ENCOUNTER — Ambulatory Visit (INDEPENDENT_AMBULATORY_CARE_PROVIDER_SITE_OTHER): Payer: BC Managed Care – PPO | Admitting: Family Medicine

## 2020-10-19 ENCOUNTER — Encounter: Payer: Self-pay | Admitting: Family Medicine

## 2020-10-19 ENCOUNTER — Other Ambulatory Visit: Payer: Self-pay

## 2020-10-19 VITALS — BP 149/80 | HR 55 | Ht 72.0 in | Wt 193.0 lb

## 2020-10-19 DIAGNOSIS — I10 Essential (primary) hypertension: Secondary | ICD-10-CM

## 2020-10-19 DIAGNOSIS — M1A0790 Idiopathic chronic gout, unspecified ankle and foot, without tophus (tophi): Secondary | ICD-10-CM | POA: Diagnosis not present

## 2020-10-19 MED ORDER — PREDNISONE 20 MG PO TABS: ORAL_TABLET | ORAL | 0 refills | Status: AC

## 2020-10-19 NOTE — Assessment & Plan Note (Signed)
Continue daily allopurinol. Will check uric acid level next month so can adjust the allopurinol for now continue with the Methylpred pack.  Also gave a prescription for prednisone taper to do in case there is a rebound after coming off the medication.  Work note provided to cover for today and tomorrow okay to return on Monday.  Conservative care of the ankle.

## 2020-10-19 NOTE — Progress Notes (Signed)
Started Tuesday 1 am Pain left foot Doubled up on allopurinol, helped some Got worse last night Took Prednisone she had leftover  2 tabs at 3 am 1 tab at 8 am 1 tab at 1 pm Was going to go to ER but couldn't walk down her stairs No changes in diet No processed foods/sodas Has been really healthy since July

## 2020-10-19 NOTE — Progress Notes (Signed)
Acute Office Visit  Subjective:    Patient ID: Jesiah Grismer, adult    DOB: 1970/03/09, 50 y.o.   MRN: [id number]  Chief Complaint  Patient presents with   Gout    HPI Patient is in today for Left ankle pain.  Hx of gout, couple of years since last flare.  On allopurinol daily.  Started Tuesday 1 am Doubled up on allopurinol, helped some Got worse last night Took Methylpred she had leftover  2 tabs at 3 am 1 tab at 8 am 1 tab at 1 pm Was going to go to ER but couldn't walk down her stairs No changes in diet  Has been working hard to exercise regularly and lose weight.  Has been going to the gym and doing the elliptical for an hour and 15 minutes 5 days/week.  And doing some resistance training. No processed foods/sodas Has been really healthy since July  HTN  -blood pressure pill was making her feel sluggish and as she was losing weight she decided to start cutting it in half and has been doing well with that dose.  Past Medical History:  Diagnosis Date   Depression    Gout    Hypertension     No past surgical history on file.  Family History  Problem Relation Age of Onset   Hypothyroidism Mother    Hypertension Mother    Atrial fibrillation Mother    Kidney disease Mother     Social History   Socioeconomic History   Marital status: Single    Spouse name: Not on file   Number of children: Not on file   Years of education: Not on file   Highest education level: Not on file  Occupational History   Not on file  Tobacco Use   Smoking status: Never   Smokeless tobacco: Never  Substance and Sexual Activity   Alcohol use: Yes    Comment: 50-60 beers a week   Drug use: Not Currently   Sexual activity: Not on file  Other Topics Concern   Not on file  Social History Narrative   Not on file   Social Determinants of Health   Financial Resource Strain: Not on file  Food Insecurity: Not on file  Transportation Needs: Not on file  Physical Activity: Not on  file  Stress: Not on file  Social Connections: Not on file  Intimate Partner Violence: Not on file    Outpatient Medications Prior to Visit  Medication Sig Dispense Refill   allopurinol (ZYLOPRIM) 100 MG tablet TAKE 1 TABLET BY MOUTH EVERY DAY 90 tablet 0   diclofenac Sodium (VOLTAREN) 1 % GEL Apply 2 g topically 4 (four) times daily. To outer elbow 100 g 1   ibuprofen (ADVIL) 800 MG tablet Take 1 tablet (800 mg total) by mouth 3 (three) times daily. 21 tablet 0   losartan-hydrochlorothiazide (HYZAAR) 100-25 MG tablet Take 1 tablet by mouth daily. 91 tablet 1   metoprolol succinate (TOPROL-XL) 100 MG 24 hr tablet Take 1 tablet (100 mg total) by mouth daily. 90 tablet 1   indomethacin (INDOCIN) 50 MG capsule Take 1 capsule (50 mg total) by mouth 3 (three) times daily with meals. (Patient not taking: Reported on 10/19/2020) 30 capsule 2   No facility-administered medications prior to visit.    Allergies  Allergen Reactions   Bee Venom Swelling   Onion Hives   Amlodipine Swelling    Leg swelling    Buspar [Buspirone] Other (See Comments)  Caused sweating   Mushroom Extract Complex Hives    Review of Systems     Objective:    Physical Exam Vitals reviewed.  Constitutional:      Appearance: He is well-developed.  HENT:     Head: Normocephalic and atraumatic.  Eyes:     Conjunctiva/sclera: Conjunctivae normal.  Cardiovascular:     Rate and Rhythm: Normal rate.  Pulmonary:     Effort: Pulmonary effort is normal.  Musculoskeletal:     Comments: Left ankle is swollen and very tender. No erythema.  No sig inc warmth.   Skin:    General: Skin is dry.     Coloration: Skin is not pale.  Neurological:     Mental Status: He is alert and oriented to person, place, and time.  Psychiatric:        Behavior: Behavior normal.    BP (!) 149/80   Pulse (!) 55   Ht 6' (1.829 m)   Wt 193 lb (87.5 kg)   SpO2 99%   BMI 26.18 kg/m  Wt Readings from Last 3 Encounters:  10/19/20  193 lb (87.5 kg)  06/22/20 195 lb (88.5 kg)  05/12/20 198 lb (89.8 kg)    Health Maintenance Due  Topic Date Due   Hepatitis C Screening  Never done   TETANUS/TDAP  Never done   PAP SMEAR-Modifier  Never done   COLONOSCOPY (Pts 45-62yrs Insurance coverage will need to be confirmed)  Never done   MAMMOGRAM  Never done   Zoster Vaccines- Shingrix (1 of 2) Never done   COVID-19 Vaccine (3 - Booster for Pfizer series) 03/31/2020   INFLUENZA VACCINE  09/25/2020    There are no preventive care reminders to display for this patient.   Lab Results  Component Value Date   TSH 1.69 01/14/2019   Lab Results  Component Value Date   WBC 6.8 03/23/2019   HGB 15.2 03/23/2019   HCT 43.3 03/23/2019   MCV 92.1 03/23/2019   PLT 217 03/23/2019   Lab Results  Component Value Date   NA 139 03/23/2019   K 4.4 03/23/2019   CO2 27 03/23/2019   GLUCOSE 115 (H) 03/23/2019   BUN 16 03/23/2019   CREATININE 1.22 03/23/2019   BILITOT 1.0 03/23/2019   ALKPHOS 96 09/26/2017   AST 30 03/23/2019   ALT 47 (H) 03/23/2019   PROT 6.9 03/23/2019   ALBUMIN 3.4 (L) 09/26/2017   CALCIUM 9.7 03/23/2019   ANIONGAP 6 09/29/2017   Lab Results  Component Value Date   CHOL 204 (H) 03/23/2019   Lab Results  Component Value Date   HDL 46 03/23/2019   Lab Results  Component Value Date   LDLCALC 121 (H) 03/23/2019   Lab Results  Component Value Date   TRIG 262 (H) 03/23/2019   Lab Results  Component Value Date   CHOLHDL 4.4 03/23/2019   Lab Results  Component Value Date   HGBA1C 5.5 03/22/2019       Assessment & Plan:   Problem List Items Addressed This Visit       Cardiovascular and Mediastinum   Essential hypertension, benign    Blood pressure mildly elevated today only taking half a tab a blood pressure pill since has lost about 30 pounds.  We will keep an eye on it for now.  She is in a lot of pain today but I do think is probably contributing.        Musculoskeletal and  Integument  Gout of foot - Primary    Continue daily allopurinol. Will check uric acid level next month so can adjust the allopurinol for now continue with the Methylpred pack.  Also gave a prescription for prednisone taper to do in case there is a rebound after coming off the medication.  Work note provided to cover for today and tomorrow okay to return on Monday.  Conservative care of the ankle.      Relevant Medications   predniSONE (DELTASONE) 20 MG tablet     Meds ordered this encounter  Medications   predniSONE (DELTASONE) 20 MG tablet    Sig: Take 2 tablets (40 mg total) by mouth daily with breakfast for 4 days, THEN 1 tablet (20 mg total) daily with breakfast for 4 days.    Dispense:  12 tablet    Refill:  0      Nani Gasser, MD

## 2020-10-19 NOTE — Assessment & Plan Note (Signed)
Blood pressure mildly elevated today only taking half a tab a blood pressure pill since has lost about 30 pounds.  We will keep an eye on it for now.  She is in a lot of pain today but I do think is probably contributing.

## 2020-10-20 ENCOUNTER — Encounter: Payer: Self-pay | Admitting: Family Medicine

## 2020-12-06 ENCOUNTER — Other Ambulatory Visit: Payer: Self-pay | Admitting: Family Medicine

## 2020-12-06 DIAGNOSIS — I10 Essential (primary) hypertension: Secondary | ICD-10-CM

## 2020-12-07 ENCOUNTER — Encounter: Payer: Self-pay | Admitting: Family Medicine

## 2020-12-07 NOTE — Telephone Encounter (Signed)
Informed pt that RX was sent to pharmacy on 12/06/2020 via MyChart.  Routing message to Rica Mote and Dr. Linford Arnold as FYI for pt's appointment on Monday.  Tiajuana Amass, CMA

## 2020-12-11 ENCOUNTER — Ambulatory Visit: Payer: BC Managed Care – PPO | Admitting: Family Medicine

## 2020-12-11 ENCOUNTER — Encounter: Payer: Self-pay | Admitting: Family Medicine

## 2020-12-11 DIAGNOSIS — M79602 Pain in left arm: Secondary | ICD-10-CM | POA: Diagnosis not present

## 2020-12-11 DIAGNOSIS — Z888 Allergy status to other drugs, medicaments and biological substances status: Secondary | ICD-10-CM | POA: Diagnosis not present

## 2020-12-11 DIAGNOSIS — R001 Bradycardia, unspecified: Secondary | ICD-10-CM | POA: Diagnosis not present

## 2020-12-11 DIAGNOSIS — R079 Chest pain, unspecified: Secondary | ICD-10-CM | POA: Diagnosis not present

## 2020-12-11 DIAGNOSIS — I10 Essential (primary) hypertension: Secondary | ICD-10-CM | POA: Diagnosis not present

## 2020-12-11 DIAGNOSIS — R0789 Other chest pain: Secondary | ICD-10-CM | POA: Diagnosis not present

## 2020-12-11 DIAGNOSIS — Z91018 Allergy to other foods: Secondary | ICD-10-CM | POA: Diagnosis not present

## 2020-12-11 DIAGNOSIS — R202 Paresthesia of skin: Secondary | ICD-10-CM | POA: Diagnosis not present

## 2020-12-11 DIAGNOSIS — Z79899 Other long term (current) drug therapy: Secondary | ICD-10-CM | POA: Diagnosis not present

## 2020-12-11 DIAGNOSIS — Z91030 Bee allergy status: Secondary | ICD-10-CM | POA: Diagnosis not present

## 2020-12-11 NOTE — Telephone Encounter (Signed)
I tried to call patient, no answer or voicemail.

## 2020-12-13 ENCOUNTER — Telehealth: Payer: Self-pay | Admitting: General Practice

## 2020-12-13 NOTE — Telephone Encounter (Signed)
Transition Care Management Follow-up Telephone Call Date of discharge and from where: 12/11/20 from [facility] How have you been since you were released from the hospital? Doing better. Any questions or concerns? No  Items Reviewed: Did the pt receive and understand the discharge instructions provided? Yes  Medications obtained and verified? Yes  Other? No  Any new allergies since your discharge? No  Dietary orders reviewed? Yes Do you have support at home? Yes   Home Care and Equipment/Supplies: Were home health services ordered? no  Functional Questionnaire: (I = Independent and D = Dependent) ADLs: I  Bathing/Dressing- I  Meal Prep- I  Eating- I  Maintaining continence- I  Transferring/Ambulation- I  Managing Meds- I  Follow up appointments reviewed:  PCP Hospital f/u appt confirmed? Yes  Scheduled to see Dr. Linford Arnold on 12/14/20. Specialist Hospital f/u appt confirmed? Yes  Scheduled to see the cardiologist on 12/14/20 @ 1pm. Are transportation arrangements needed? No  If their condition worsens, is the pt aware to call PCP or go to the Emergency Dept.? Yes Was the patient provided with contact information for the PCP's office or ED? Yes Was to pt encouraged to call back with questions or concerns? Yes

## 2020-12-14 ENCOUNTER — Encounter: Payer: Self-pay | Admitting: Family Medicine

## 2020-12-14 ENCOUNTER — Ambulatory Visit (INDEPENDENT_AMBULATORY_CARE_PROVIDER_SITE_OTHER): Payer: BC Managed Care – PPO | Admitting: Family Medicine

## 2020-12-14 VITALS — BP 140/76 | HR 57 | Ht 72.0 in | Wt 201.0 lb

## 2020-12-14 DIAGNOSIS — M79645 Pain in left finger(s): Secondary | ICD-10-CM | POA: Diagnosis not present

## 2020-12-14 DIAGNOSIS — R002 Palpitations: Secondary | ICD-10-CM

## 2020-12-14 DIAGNOSIS — R0789 Other chest pain: Secondary | ICD-10-CM

## 2020-12-14 DIAGNOSIS — R5383 Other fatigue: Secondary | ICD-10-CM | POA: Diagnosis not present

## 2020-12-14 DIAGNOSIS — M79671 Pain in right foot: Secondary | ICD-10-CM | POA: Diagnosis not present

## 2020-12-14 DIAGNOSIS — M79672 Pain in left foot: Secondary | ICD-10-CM

## 2020-12-14 DIAGNOSIS — G8929 Other chronic pain: Secondary | ICD-10-CM

## 2020-12-14 NOTE — Progress Notes (Signed)
Established Patient Office Visit  Subjective:  Patient ID: James Fisher, adult    DOB: Jun 11, 1970  Age: 50 y.o. MRN: [id number]  CC:  Chief Complaint  Patient presents with   Hospitalization Follow-up    HPI [name] presents for follow-up emergency department visit on December 11, 2020, 3 days ago.  Presented to the ED for chest pain and a feeling of the heart pounding.  Became very anxious and started experiencing pressure in the chest and then tingling down to the left arm also reports feeling diaphoretic.  Labs were normal and reassuring.  Chest x-ray was also normal.  EKG was reassuring as well.   Still having some chest pressure on that left side with a little bit of radiation down the left arm it seems to be constant but not as intense as it was.  That night [name] woke up with feeling the heart pounding.  It was not going fast but could hear it in his ears.  And then felt some fluttering that lasted a few seconds.  This has happened before sometimes a couple times a year.  He is scheduled for a stress test at [facility] in [place] next week.  Also complains of just feeling extremely tired with no energy over the last month or so.  No fevers, night sweats or swollen lymph nodes.  Bilateral foot pain mostly on the bottom of the feet.  Not localized to 1 specific area the pain can feel sharp at times but also numb and tingly.  It seems to be over the bottom of the foot entirely.  The left foot is more bothersome than the right.  It seems to get worse during the day the more he is walking.  Also seems to be aggravated by wearing shoes and is better when not wearing shoes.  Complains of left thumb pain near the wrist for about 6 months its been sore and painful.  Past Medical History:  Diagnosis Date   Depression    Gout    Hypertension     No past surgical history on file.  Family History  Problem Relation Age of Onset   Hypothyroidism Mother    Hypertension Mother    Atrial  fibrillation Mother    Kidney disease Mother     Social History   Socioeconomic History   Marital status: Single    Spouse name: Not on file   Number of children: Not on file   Years of education: Not on file   Highest education level: Not on file  Occupational History   Not on file  Tobacco Use   Smoking status: Never   Smokeless tobacco: Never  Substance and Sexual Activity   Alcohol use: Yes    Comment: 50-60 beers a week   Drug use: Not Currently   Sexual activity: Not on file  Other Topics Concern   Not on file  Social History Narrative   Not on file   Social Determinants of Health   Financial Resource Strain: Not on file  Food Insecurity: Not on file  Transportation Needs: Not on file  Physical Activity: Not on file  Stress: Not on file  Social Connections: Not on file  Intimate Partner Violence: Not on file    Outpatient Medications Prior to Visit  Medication Sig Dispense Refill   allopurinol (ZYLOPRIM) 100 MG tablet TAKE 1 TABLET BY MOUTH EVERY DAY 90 tablet 0   ibuprofen (ADVIL) 800 MG tablet Take 1 tablet (800 mg total)  by mouth 3 (three) times daily. 21 tablet 0   losartan-hydrochlorothiazide (HYZAAR) 100-25 MG tablet TAKE 1 TABLET BY MOUTH EVERY DAY 90 tablet 1   metoprolol succinate (TOPROL-XL) 100 MG 24 hr tablet TAKE 1 TABLET BY MOUTH EVERY DAY 90 tablet 1   diclofenac Sodium (VOLTAREN) 1 % GEL Apply 2 g topically 4 (four) times daily. To outer elbow 100 g 1   indomethacin (INDOCIN) 50 MG capsule Take 1 capsule (50 mg total) by mouth 3 (three) times daily with meals. (Patient not taking: Reported on 10/19/2020) 30 capsule 2   No facility-administered medications prior to visit.    Allergies  Allergen Reactions   Bee Venom Swelling   Onion Hives   Amlodipine Swelling    Leg swelling    Buspar [Buspirone] Other (See Comments)    Caused sweating   Mushroom Extract Complex Hives    ROS Review of Systems    Objective:    Physical  Exam Constitutional:      Appearance: Normal appearance. He is well-developed.  HENT:     Head: Normocephalic and atraumatic.  Cardiovascular:     Rate and Rhythm: Normal rate and regular rhythm.     Heart sounds: Normal heart sounds.  Pulmonary:     Effort: Pulmonary effort is normal.     Breath sounds: Normal breath sounds.  Musculoskeletal:     Comments: Tender at the [facility] joint on the left thumb.  Normal range of motion and strength. Left foot with normal range of motion.  No increased laxity.  Nontender over the base of the foot or over the metatarsals.  Slightly tender over the distal metatarsal heads.  Skin:    General: Skin is warm and dry.  Neurological:     Mental Status: He is alert and oriented to person, place, and time. Mental status is at baseline.  Psychiatric:        Behavior: Behavior normal.    BP 140/76   Pulse (!) 57   Ht 6' (1.829 m)   Wt 201 lb (91.2 kg)   SpO2 100%   BMI 27.26 kg/m  Wt Readings from Last 3 Encounters:  12/14/20 201 lb (91.2 kg)  10/19/20 193 lb (87.5 kg)  06/22/20 195 lb (88.5 kg)     Health Maintenance Due  Topic Date Due   Hepatitis C Screening  Never done   TETANUS/TDAP  Never done   PAP SMEAR-Modifier  Never done   COLONOSCOPY (Pts 45-17yrs Insurance coverage will need to be confirmed)  Never done   MAMMOGRAM  Never done   Zoster Vaccines- Shingrix (1 of 2) Never done   COVID-19 Vaccine (3 - Booster for Pfizer series) 12/25/2019   INFLUENZA VACCINE  Never done    There are no preventive care reminders to display for this patient.  Lab Results  Component Value Date   TSH 1.69 01/14/2019   Lab Results  Component Value Date   WBC 6.8 03/23/2019   HGB 15.2 03/23/2019   HCT 43.3 03/23/2019   MCV 92.1 03/23/2019   PLT 217 03/23/2019   Lab Results  Component Value Date   NA 139 03/23/2019   K 4.4 03/23/2019   CO2 27 03/23/2019   GLUCOSE 115 (H) 03/23/2019   BUN 16 03/23/2019   CREATININE 1.22 03/23/2019    BILITOT 1.0 03/23/2019   ALKPHOS 96 09/26/2017   AST 30 03/23/2019   ALT 47 (H) 03/23/2019   PROT 6.9 03/23/2019   ALBUMIN 3.4 (L)  09/26/2017   CALCIUM 9.7 03/23/2019   ANIONGAP 6 09/29/2017   Lab Results  Component Value Date   CHOL 204 (H) 03/23/2019   Lab Results  Component Value Date   HDL 46 03/23/2019   Lab Results  Component Value Date   LDLCALC 121 (H) 03/23/2019   Lab Results  Component Value Date   TRIG 262 (H) 03/23/2019   Lab Results  Component Value Date   CHOLHDL 4.4 03/23/2019   Lab Results  Component Value Date   HGBA1C 5.5 03/22/2019      Assessment & Plan:   Problem List Items Addressed This Visit   None Visit Diagnoses     Other fatigue    -  Primary   Relevant Orders   TSH   Chronic pain of left thumb       Relevant Orders   DG Hand Complete Left   Bilateral foot pain       Relevant Orders   Ambulatory referral to Podiatry   Pounding heartbeat       Relevant Orders   LONG TERM MONITOR (3-14 DAYS)   Palpitation       Relevant Orders   LONG TERM MONITOR (3-14 DAYS)   Atypical chest pain       Relevant Orders   LONG TERM MONITOR (3-14 DAYS)       Pain over the left CMC joint-most consistent with arthritis.  We discussed possible use of topical NSAIDs or oral NSAIDs, may [place] a brace for support and possibly an injection if not improving but we will start with plain film x-ray and discuss treatment going forward at that point. + right-handed.  Bilateral foot pain left greater than right-unclear etiology at this point not consistent with plantar fasciitis or metatarsalgia.  He may be developing some type of neuropathy though also consider neuroma is a possibility.  We will go ahead and refer to podiatry for further work-up and evaluation.   Palpitations/heart pounding-we will move forward with cardiac monitor he already has a stress test to address the chest pain next week.  Fatigue-we will check thyroid.  He was on thyroid  medication several years ago and does have a positive family history.  He also has not been able to get to the gym and exercise regularly because of the foot pain.  No orders of the defined types were placed in this encounter.   Follow-up: No follow-ups on file.    Nani Gasser, MD

## 2020-12-15 LAB — TSH: TSH: 2.6 mIU/L (ref 0.40–4.50)

## 2020-12-15 NOTE — Progress Notes (Signed)
Your lab work is within acceptable range and there are no concerning findings.   ?

## 2020-12-18 ENCOUNTER — Ambulatory Visit (INDEPENDENT_AMBULATORY_CARE_PROVIDER_SITE_OTHER): Payer: BC Managed Care – PPO

## 2020-12-18 ENCOUNTER — Other Ambulatory Visit: Payer: Self-pay

## 2020-12-18 DIAGNOSIS — M79645 Pain in left finger(s): Secondary | ICD-10-CM | POA: Diagnosis not present

## 2020-12-18 DIAGNOSIS — R079 Chest pain, unspecified: Secondary | ICD-10-CM | POA: Diagnosis not present

## 2020-12-18 DIAGNOSIS — G8929 Other chronic pain: Secondary | ICD-10-CM

## 2020-12-18 DIAGNOSIS — M778 Other enthesopathies, not elsewhere classified: Secondary | ICD-10-CM | POA: Diagnosis not present

## 2020-12-18 DIAGNOSIS — M19042 Primary osteoarthritis, left hand: Secondary | ICD-10-CM | POA: Diagnosis not present

## 2020-12-18 DIAGNOSIS — I10 Essential (primary) hypertension: Secondary | ICD-10-CM | POA: Diagnosis not present

## 2020-12-20 ENCOUNTER — Encounter: Payer: Self-pay | Admitting: Family Medicine

## 2020-12-20 NOTE — Progress Notes (Signed)
The thumb does show some mild bone spurring and arthritis.  We could try putting you in a splint that can arrest the thumb that you can wear count of as needed.  You would not want to wear it all the time because you do not want a make the muscles in your hand weak but it can be used on days were maybe the thumb feels particularly sore.  Sometimes people benefit from a steroid injection as well so if that something you are interested in we can get you scheduled with our sports med doc.

## 2020-12-27 ENCOUNTER — Encounter: Payer: Self-pay | Admitting: Family Medicine

## 2020-12-27 ENCOUNTER — Other Ambulatory Visit: Payer: Self-pay | Admitting: Family Medicine

## 2020-12-27 DIAGNOSIS — M1A0790 Idiopathic chronic gout, unspecified ankle and foot, without tophus (tophi): Secondary | ICD-10-CM

## 2020-12-27 MED ORDER — ALLOPURINOL 100 MG PO TABS
100.0000 mg | ORAL_TABLET | Freq: Every day | ORAL | 0 refills | Status: DC
Start: 2020-12-27 — End: 2020-12-29

## 2020-12-27 MED ORDER — PREDNISONE 20 MG PO TABS: ORAL_TABLET | ORAL | 1 refills | Status: DC

## 2020-12-27 NOTE — Telephone Encounter (Signed)
Pred sent. Uric acid ordered for lab. May be time to inc allopurinol. Ok for work note for today. Pls doc. I hate losing job.   Meds ordered this encounter  Medications   predniSONE (DELTASONE) 20 MG tablet    Sig: Take 2 tablets (40 mg total) by mouth daily with breakfast for 3 days, THEN 1 tablet (20 mg total) daily with breakfast for 3 days, THEN 0.5 tablets (10 mg total) daily with breakfast for 2 days.    Dispense:  10 tablet    Refill:  1

## 2020-12-28 ENCOUNTER — Other Ambulatory Visit: Payer: Self-pay

## 2020-12-28 ENCOUNTER — Encounter: Payer: Self-pay | Admitting: Podiatry

## 2020-12-28 ENCOUNTER — Ambulatory Visit (INDEPENDENT_AMBULATORY_CARE_PROVIDER_SITE_OTHER): Payer: BC Managed Care – PPO | Admitting: Podiatry

## 2020-12-28 ENCOUNTER — Ambulatory Visit (HOSPITAL_BASED_OUTPATIENT_CLINIC_OR_DEPARTMENT_OTHER)
Admission: RE | Admit: 2020-12-28 | Discharge: 2020-12-28 | Disposition: A | Discharge status: Home / Self Care | Payer: BC Managed Care – PPO | Source: Ambulatory Visit | Attending: Podiatry | Admitting: Podiatry

## 2020-12-28 DIAGNOSIS — M79672 Pain in left foot: Secondary | ICD-10-CM

## 2020-12-28 DIAGNOSIS — M79671 Pain in right foot: Secondary | ICD-10-CM

## 2020-12-28 DIAGNOSIS — G5763 Lesion of plantar nerve, bilateral lower limbs: Secondary | ICD-10-CM

## 2020-12-28 DIAGNOSIS — L603 Nail dystrophy: Secondary | ICD-10-CM

## 2020-12-28 DIAGNOSIS — M1A0790 Idiopathic chronic gout, unspecified ankle and foot, without tophus (tophi): Secondary | ICD-10-CM | POA: Diagnosis not present

## 2020-12-28 MED ORDER — MELOXICAM 15 MG PO TABS: 15.0000 mg | ORAL_TABLET | Freq: Every day | ORAL | 0 refills | Status: DC

## 2020-12-28 NOTE — Progress Notes (Signed)
  Subjective:  Patient ID: James Fisher, adult    DOB: [birth year],   MRN: [id number]  Chief Complaint  Patient presents with   Foot Pain    Bilateral foot pain in both feet , patient has had foot pain since the start of 2022 , left great toe nail , possible ingown.    50 y.o. adult presents for bilateral foot pain that started back in January of 2022. States left is worse than right. Relates numbness that moves up the foot and painful to walk and wear shoes. Does have a history of gout and last had a gout flare a year ago.  Has not tried any treatments. Also has concern for left great toe nail fungus.  . Denies any other pedal complaints. Denies n/v/f/c.   Past Medical History:  Diagnosis Date   Depression    Gout    Hypertension     Objective:  Physical Exam: Vascular: DP/PT pulses 2/4 bilateral. CFT <3 seconds. Normal hair growth on digits. No edema.  Skin. No lacerations or abrasions bilateral feet. Discolored dystrophic left hallux nail.  Musculoskeletal: MMT 5/5 bilateral lower extremities in DF, PF, Inversion and Eversion. Deceased ROM in DF of ankle joint. Tender to third interspace on left foot more than right. Pain with medial lateral squeeze of metatarsals. Positive mulders click on left.  Neurological: Sensation intact to light touch.   Assessment:   1. Morton's metatarsalgia, neuralgia, or neuroma, bilateral   2. Onychodystrophy      Plan:  Patient was evaluated and treated and all questions answered. Discussed neuroma and treatment options with patient.  Radiographs reviewed and discussed with patient. No acute fractures or dislocations noted.  Injection offered today. Patient deferred.   Discussed padding and offloading today.  Prescription for meloxicam provided.  Discussed option for left hallux nail. Discussed if pain does not improve may consider  MRI for further surgical planning.  Patient to return in 8 weeks or sooner if concerns arise.      Louann Sjogren, DPM

## 2020-12-29 ENCOUNTER — Other Ambulatory Visit: Payer: Self-pay | Admitting: Family Medicine

## 2020-12-29 DIAGNOSIS — M1A0790 Idiopathic chronic gout, unspecified ankle and foot, without tophus (tophi): Secondary | ICD-10-CM

## 2020-12-29 LAB — URIC ACID: Uric Acid, Serum: 6.9 mg/dL (ref 4.0–8.0)

## 2020-12-29 MED ORDER — ALLOPURINOL 100 MG PO TABS: 150.0000 mg | ORAL_TABLET | Freq: Every day | ORAL | 3 refills | Status: DC

## 2020-12-29 MED ORDER — COLCHICINE 0.6 MG PO TABS: 0.6000 mg | ORAL_TABLET | Freq: Every day | ORAL | 1 refills | Status: DC

## 2020-12-29 NOTE — Progress Notes (Signed)
Once you are through this flare let's bump up your allopurinol.  Goal is to get that uric acid under 6.  And then we can also go back and consider looking at the blood pressure pill.  The HCTZ component of your pill can cause an exacerbation in flares and it certainly could be contributing.  So we could always take a step back and just take that part out, keep the losartan and add something else in its place.  Let me know if you would be open to that.  But in the meantime I definitely want to go ahead and bump up the allopurinol.  I am also can put you on colchicine for the first 6 months after we bump up the allopurinol you will take 1 a day to keep you from having flares as we adjust the dose.

## 2021-02-23 ENCOUNTER — Ambulatory Visit: Payer: BC Managed Care – PPO | Admitting: Podiatry

## 2021-03-09 ENCOUNTER — Encounter: Payer: Self-pay | Admitting: Family Medicine

## 2021-03-09 DIAGNOSIS — M1A0790 Idiopathic chronic gout, unspecified ankle and foot, without tophus (tophi): Secondary | ICD-10-CM

## 2021-03-09 DIAGNOSIS — I10 Essential (primary) hypertension: Secondary | ICD-10-CM

## 2021-03-09 MED ORDER — ALLOPURINOL 100 MG PO TABS: 150.0000 mg | ORAL_TABLET | Freq: Every day | ORAL | 3 refills | Status: DC

## 2021-03-09 MED ORDER — LOSARTAN POTASSIUM-HCTZ 100-25 MG PO TABS: 1.0000 | ORAL_TABLET | Freq: Every day | ORAL | 1 refills | Status: DC

## 2021-03-09 MED ORDER — METOPROLOL SUCCINATE ER 100 MG PO TB24: 100.0000 mg | ORAL_TABLET | Freq: Every day | ORAL | 1 refills | Status: DC

## 2021-04-12 ENCOUNTER — Encounter: Payer: Self-pay | Admitting: Family Medicine

## 2021-04-18 MED ORDER — HYDROXYZINE PAMOATE 25 MG PO CAPS: 25.0000 mg | ORAL_CAPSULE | Freq: Every day | ORAL | 0 refills | Status: DC | PRN

## 2022-05-06 ENCOUNTER — Encounter: Payer: Self-pay | Admitting: Family Medicine

## 2022-05-06 DIAGNOSIS — I10 Essential (primary) hypertension: Secondary | ICD-10-CM

## 2022-05-06 MED ORDER — METOPROLOL SUCCINATE ER 100 MG PO TB24: 100.0000 mg | ORAL_TABLET | Freq: Every day | ORAL | 0 refills | Status: DC

## 2022-05-06 MED ORDER — LOSARTAN POTASSIUM-HCTZ 100-25 MG PO TABS: 1.0000 | ORAL_TABLET | Freq: Every day | ORAL | 0 refills | Status: DC

## 2022-05-07 ENCOUNTER — Other Ambulatory Visit: Payer: Self-pay

## 2022-05-07 DIAGNOSIS — I10 Essential (primary) hypertension: Secondary | ICD-10-CM

## 2022-05-07 MED ORDER — METOPROLOL SUCCINATE ER 100 MG PO TB24: 100.0000 mg | ORAL_TABLET | Freq: Every day | ORAL | 2 refills | Status: DC

## 2022-05-07 MED ORDER — LOSARTAN POTASSIUM-HCTZ 100-25 MG PO TABS: 1.0000 | ORAL_TABLET | Freq: Every day | ORAL | 2 refills | Status: DC

## 2022-05-07 NOTE — Telephone Encounter (Signed)
Refilled per protocol as 90 for 90 to [facility]. [place]

## 2022-05-22 ENCOUNTER — Encounter: Payer: Self-pay | Admitting: Family Medicine

## 2022-05-22 DIAGNOSIS — M1A0790 Idiopathic chronic gout, unspecified ankle and foot, without tophus (tophi): Secondary | ICD-10-CM

## 2022-05-22 MED ORDER — PREDNISONE 20 MG PO TABS: 40.0000 mg | ORAL_TABLET | Freq: Every day | ORAL | 0 refills | Status: DC

## 2022-05-22 MED ORDER — ALLOPURINOL 100 MG PO TABS: 150.0000 mg | ORAL_TABLET | Freq: Every day | ORAL | 3 refills | Status: DC

## 2022-05-22 MED ORDER — COLCHICINE 0.6 MG PO TABS: 0.6000 mg | ORAL_TABLET | Freq: Every day | ORAL | 1 refills | Status: DC

## 2022-05-22 NOTE — Telephone Encounter (Signed)
Agree with below advice.

## 2022-05-23 ENCOUNTER — Telehealth: Payer: Self-pay | Admitting: General Practice

## 2022-05-23 NOTE — Transitions of Care (Post Inpatient/ED Visit) (Signed)
   05/23/2022  Name: James Fisher MRN: [id number] DOB: 04-08-1970  Today's TOC FU Call Status: Today's TOC FU Call Status:: Successful TOC FU Call Competed TOC FU Call Complete Date: 05/23/22  Transition Care Management Follow-up Telephone Call Date of Discharge: 05/23/22 Discharge Facility: Other ([place]) Name of Other (Non[facility]) Discharge Facility: [facility] Type of Discharge: Emergency Department Reason for ED Visit: Cardiac Conditions Cardiac Conditions Diagnosis:  (Hypertension) How have you been since you were released from the hospital?: Better Any questions or concerns?: No  Items Reviewed: Did you receive and understand the discharge instructions provided?: Yes Medications obtained and verified?: Yes (Medications Reviewed) Any new allergies since your discharge?: No Dietary orders reviewed?: NA Do you have support at home?: Yes  Home Care and Equipment/Supplies: [place] Ordered?: NA Any new equipment or medical supplies ordered?: NA  Functional Questionnaire: Do you need assistance with bathing/showering or dressing?: No Do you need assistance with meal preparation?: No Do you need assistance with eating?: No Do you have difficulty maintaining continence: No Do you need assistance with getting out of bed/getting out of a chair/moving?: No Do you have difficulty managing or taking your medications?: No  Follow up appointments reviewed: PCP Follow-up appointment confirmed?: Yes Date of PCP follow-up appointment?: 05/28/22 Follow-up Provider: Dr. Madilyn Fireman Specialist [facility] Follow-up appointment confirmed?: NA Do you need transportation to your follow-up appointment?: No Do you understand care options if your condition(s) worsen?: Yes-patient verbalized understanding    SIGNATURE: Tinnie Gens, RN BSN

## 2022-05-28 ENCOUNTER — Ambulatory Visit: Payer: BC Managed Care – PPO | Admitting: Family Medicine

## 2022-06-07 ENCOUNTER — Ambulatory Visit: Payer: BC Managed Care – PPO | Admitting: Family Medicine

## 2022-07-30 ENCOUNTER — Ambulatory Visit: Payer: 59 | Admitting: Family Medicine

## 2022-07-30 ENCOUNTER — Encounter: Payer: Self-pay | Admitting: Family Medicine

## 2022-07-30 VITALS — BP 151/92 | HR 66 | Wt 217.0 lb

## 2022-07-30 DIAGNOSIS — R7989 Other specified abnormal findings of blood chemistry: Secondary | ICD-10-CM | POA: Diagnosis not present

## 2022-07-30 DIAGNOSIS — Z0000 Encounter for general adult medical examination without abnormal findings: Secondary | ICD-10-CM | POA: Diagnosis not present

## 2022-07-30 DIAGNOSIS — I10 Essential (primary) hypertension: Secondary | ICD-10-CM | POA: Diagnosis not present

## 2022-07-30 DIAGNOSIS — R7309 Other abnormal glucose: Secondary | ICD-10-CM

## 2022-07-30 DIAGNOSIS — G8929 Other chronic pain: Secondary | ICD-10-CM

## 2022-07-30 DIAGNOSIS — E785 Hyperlipidemia, unspecified: Secondary | ICD-10-CM

## 2022-07-30 DIAGNOSIS — M25561 Pain in right knee: Secondary | ICD-10-CM

## 2022-07-30 MED ORDER — VALSARTAN-HYDROCHLOROTHIAZIDE 320-25 MG PO TABS: 1.0000 | ORAL_TABLET | Freq: Every day | ORAL | 1 refills | Status: DC

## 2022-07-30 MED ORDER — METOPROLOL SUCCINATE ER 100 MG PO TB24: 100.0000 mg | ORAL_TABLET | Freq: Two times a day (BID) | ORAL | 3 refills | Status: DC

## 2022-07-30 MED ORDER — SPIRONOLACTONE 25 MG PO TABS: 25.0000 mg | ORAL_TABLET | Freq: Every day | ORAL | 0 refills | Status: DC

## 2022-07-30 NOTE — Patient Instructions (Signed)
I am changing your losartan/HCTZ to valsartan HCTZ.  Still once a day. Metoprolol increased to twice a day.  Try to schedule them about 12 hours apart if you can.  It does not have to be exact though. Adding another medication called spironolactone 25 mg.  Take it before work today.

## 2022-07-30 NOTE — Assessment & Plan Note (Addendum)
Uncontrolled.  Will switch losartan to valsartan.  Will increase metoprolol to twice a day on his prescription though he is already actually taking it twice today.  And will add spironolactone for better blood pressure control.  We need to get him down about 15 points from where he is at currently.  Will get updated labs in about 3 to 4 weeks.  Had a CBC and a CMP done in the hospital back in March.

## 2022-07-30 NOTE — Progress Notes (Signed)
Established Patient Office Visit  Subjective   Patient ID: James Fisher, male    DOB: 11-01-1970  Age: 52 y.o. MRN: [id number]  Chief Complaint  Patient presents with   Hypertension   mood    HPI  Hypertension- Pt denies chest pain, SOB, dizziness, or heart palpitations.  Taking meds as directed w/o problems.  Denies medication side effects.  Has been taking losartan/HCTZ daily and the metoprolol twice a day.  He has tried amlodipine in the past and had significant lower extremity edema that was quite severe.  Wants to discuss mood as well.  Has felt flat recently.  Not necessarily depressed.  Is not interested in any medication at this time.  He injured his right knee at work last summer it was probably around August or September.  He was lifting a very heavy glass doors and felt a sudden pop in his knee as he was walking on an uneven surface.  It has been painful on and off since then.  For the first 4 months he actually had to wear knee brace for support.  They did not allow him to be evaluated under Worker's Comp. at that time.  We discussed getting in with Dr. Karie Schwalbe for further evaluation he is our sports med doc care.  Has a new job and new insurance just kicked in.      ROS    Objective:     BP (!) 151/92   Pulse 66   Wt 217 lb (98.4 kg)   SpO2 96%   BMI 29.43 kg/m    Physical Exam Constitutional:      Appearance: He is well-developed.  HENT:     Head: Normocephalic and atraumatic.  Neck:     Comments: No carotid bruits.   Cardiovascular:     Rate and Rhythm: Normal rate and regular rhythm.     Heart sounds: Normal heart sounds.  Pulmonary:     Effort: Pulmonary effort is normal.     Breath sounds: Normal breath sounds.  Skin:    General: Skin is warm and dry.  Neurological:     Mental Status: He is alert and oriented to person, place, and time.  Psychiatric:        Behavior: Behavior normal.    No results found for any visits on 07/30/22.    The  ASCVD Risk score (Arnett DK, et al., 2019) failed to calculate for the following reasons:   Cannot find a previous HDL lab   Cannot find a previous total cholesterol lab    Assessment & Plan:   Problem List Items Addressed This Visit       Cardiovascular and Mediastinum   Essential hypertension, benign - Primary    Uncontrolled.  Will switch losartan to valsartan.  Will increase metoprolol to twice a day on his prescription though he is already actually taking it twice today.  And will add spironolactone for better blood pressure control.  We need to get him down about 15 points from where he is at currently.  Will get updated labs in about 3 to 4 weeks.  Had a CBC and a CMP done in the hospital back in March.      Relevant Medications   valsartan-hydrochlorothiazide (DIOVAN-HCT) 320-25 MG tablet   metoprolol succinate (TOPROL-XL) 100 MG 24 hr tablet   spironolactone (ALDACTONE) 25 MG tablet   Other Relevant Orders   US Carotid Duplex Bilateral     Other   Hyperlipidemia  Relevant Medications   valsartan-hydrochlorothiazide (DIOVAN-HCT) 320-25 MG tablet   metoprolol succinate (TOPROL-XL) 100 MG 24 hr tablet   spironolactone (ALDACTONE) 25 MG tablet   Other Relevant Orders   US Carotid Duplex Bilateral   Other Visit Diagnoses     Abnormal glucose       Relevant Orders   US Carotid Duplex Bilateral   Elevated liver function tests       Wellness examination       Relevant Orders   COMPLETE METABOLIC PANEL WITH GFR   Lipid Panel w/reflex Direct LDL   PSA   Hemoglobin A1c   Chronic pain of right knee          Plans to schedule a physical in about 4 weeks so we can get updated labs and was as well as recheck blood pressure at that time.  Recent labs in the ED also noted one of the liver enzymes elevated and glucose was a little elevated as well.  72 his labs next month we will do an A1c and check liver function.  Knee pain, Right - recommend with Dr. Benjamin Stain for  further evaluation.  It sounds like stable meniscal injury.  Also interested in having a carotid ultrasound.  Return in about 1 month (around 08/29/2022) for CPE with labs/BP check .    Nani Gasser, MD

## 2022-08-01 ENCOUNTER — Telehealth (HOSPITAL_BASED_OUTPATIENT_CLINIC_OR_DEPARTMENT_OTHER): Payer: Self-pay

## 2022-08-08 ENCOUNTER — Telehealth (HOSPITAL_BASED_OUTPATIENT_CLINIC_OR_DEPARTMENT_OTHER): Payer: Self-pay

## 2022-09-06 ENCOUNTER — Encounter: Payer: Self-pay | Admitting: Family Medicine

## 2022-09-12 ENCOUNTER — Encounter: Payer: 59 | Admitting: Family Medicine

## 2022-10-07 ENCOUNTER — Ambulatory Visit: Payer: 59 | Admitting: Family Medicine

## 2022-10-15 ENCOUNTER — Other Ambulatory Visit: Payer: Self-pay

## 2022-10-15 DIAGNOSIS — R7989 Other specified abnormal findings of blood chemistry: Secondary | ICD-10-CM

## 2022-10-15 DIAGNOSIS — E785 Hyperlipidemia, unspecified: Secondary | ICD-10-CM

## 2022-10-15 DIAGNOSIS — Z125 Encounter for screening for malignant neoplasm of prostate: Secondary | ICD-10-CM

## 2022-10-15 DIAGNOSIS — I10 Essential (primary) hypertension: Secondary | ICD-10-CM

## 2022-10-15 DIAGNOSIS — R7309 Other abnormal glucose: Secondary | ICD-10-CM

## 2022-10-15 NOTE — Progress Notes (Signed)
 Switched to [employer].

## 2022-10-16 LAB — CMP14+EGFR
ALT: 82 IU/L — ABNORMAL HIGH (ref 0–44)
AST: 61 IU/L — ABNORMAL HIGH (ref 0–40)
Albumin: 5.1 g/dL — ABNORMAL HIGH (ref 3.8–4.9)
Alkaline Phosphatase: 96 IU/L (ref 44–121)
BUN/Creatinine Ratio: 16 (ref 9–20)
BUN: 19 mg/dL (ref 6–24)
Bilirubin Total: 1.6 mg/dL — ABNORMAL HIGH (ref 0.0–1.2)
CO2: 22 mmol/L (ref 20–29)
Calcium: 9.5 mg/dL (ref 8.7–10.2)
Chloride: 95 mmol/L — ABNORMAL LOW (ref 96–106)
Creatinine, Ser: 1.16 mg/dL (ref 0.76–1.27)
Globulin, Total: 2.1 g/dL (ref 1.5–4.5)
Glucose: 97 mg/dL (ref 70–99)
Potassium: 4.3 mmol/L (ref 3.5–5.2)
Sodium: 136 mmol/L (ref 134–144)
Total Protein: 7.2 g/dL (ref 6.0–8.5)
eGFR: 76 mL/min/{1.73_m2} (ref >59)

## 2022-10-16 LAB — HEMOGLOBIN A1C
Est. average glucose Bld gHb Est-mCnc: 126 mg/dL
Hgb A1c MFr Bld: 6 % — ABNORMAL HIGH (ref 4.8–5.6)

## 2022-10-16 LAB — LIPID PANEL
Chol/HDL Ratio: 6 ratio — ABNORMAL HIGH (ref 0.0–5.0)
Cholesterol, Total: 222 mg/dL — ABNORMAL HIGH (ref 100–199)
HDL: 37 mg/dL — ABNORMAL LOW (ref >39)
LDL Chol Calc (NIH): 118 mg/dL — ABNORMAL HIGH (ref 0–99)
Triglycerides: 384 mg/dL — ABNORMAL HIGH (ref 0–149)
VLDL Cholesterol Cal: 67 mg/dL — ABNORMAL HIGH (ref 5–40)

## 2022-10-16 LAB — PSA: Prostate Specific Ag, Serum: 3.5 ng/mL (ref 0.0–4.0)

## 2022-10-16 NOTE — Progress Notes (Signed)
Hi Buford, liver functions up the AST and ALT are both a little elevated.  Will chat about it next week when I see him to see if maybe there is something going on that is causing that bump.  Cholesterol is elevated with your triglycerides being 384.  Normals less than 150.  And your LDL cholesterol is at 118 with a goal less than 100.  Current 10-year cardiovascular risk score is at 10%.  This means that it is strongly recommended for you to start a statin which is a cholesterol-lowering drug to reduce your risk for heart attack and stroke.  Also your A1c has gone up to 6.0 which is in the prediabetes range.  Will talk about improving diet by cutting back on sugars carbs and sweets and limiting sugary drinks.  The plan will be to have follow that in 6 months.  Prostate test is normal.  The 10-year ASCVD risk score (Arnett DK, et al., 2019) is: 10%   Values used to calculate the score:     Age: 3 years     Sex: Male     Is Non-Hispanic African American: No     Diabetic: No     Tobacco smoker: No     Systolic Blood Pressure: 151 mmHg     Is BP treated: Yes     HDL Cholesterol: 37 mg/dL     Total Cholesterol: 222 mg/dL

## 2022-10-22 ENCOUNTER — Encounter: Payer: Self-pay | Admitting: Family Medicine

## 2022-10-22 ENCOUNTER — Ambulatory Visit (INDEPENDENT_AMBULATORY_CARE_PROVIDER_SITE_OTHER): Payer: 59 | Admitting: Family Medicine

## 2022-10-22 VITALS — BP 103/57 | HR 68 | Ht 72.0 in | Wt 216.0 lb

## 2022-10-22 DIAGNOSIS — E782 Mixed hyperlipidemia: Secondary | ICD-10-CM | POA: Diagnosis not present

## 2022-10-22 DIAGNOSIS — R7301 Impaired fasting glucose: Secondary | ICD-10-CM | POA: Diagnosis not present

## 2022-10-22 DIAGNOSIS — Z0000 Encounter for general adult medical examination without abnormal findings: Secondary | ICD-10-CM

## 2022-10-22 DIAGNOSIS — I10 Essential (primary) hypertension: Secondary | ICD-10-CM

## 2022-10-22 DIAGNOSIS — K2100 Gastro-esophageal reflux disease with esophagitis, without bleeding: Secondary | ICD-10-CM

## 2022-10-22 NOTE — Assessment & Plan Note (Signed)
The 10-year ASCVD risk score (Arnett DK, et al., 2019) is: 10%   Values used to calculate the score:     Age: 51 years     Sex: Male     Is Non-Hispanic African American: No     Diabetic: No     Tobacco smoker: No     Systolic Blood Pressure: 151 mmHg     Is BP treated: Yes     HDL Cholesterol: 37 mg/dL     Total Cholesterol: 222 mg/dL  Discussed need for statin.

## 2022-10-22 NOTE — Assessment & Plan Note (Signed)
Plan to recheck in 6 months. Cut back on carbs and sweets.

## 2022-10-22 NOTE — Progress Notes (Signed)
Complete physical exam  Patient: James Fisher   DOB: [birth year]   52 y.o. Male  MRN: [id number]  Subjective:    Chief Complaint  Patient presents with   Annual Exam    James Fisher is a 52 y.o. male who presents today for a complete physical exam. He reports consuming a general diet. The patient does not participate in regular exercise at present. He generally feels well. He reports sleeping well. He does have additional problems to discuss today.    Also have moe symptoms particularly at night.  Usually 2 hours between evening meal and bedtime.  It will happen even if not eating anything particularly greasy spicy etc.  Labs done ahead of time.  Here to discuss those results as well.  Most recent fall risk assessment:    07/30/2022    9:05 AM  Fall Risk   Falls in the past year? 1  Number falls in past yr: 0  Injury with Fall? 1  Risk for fall due to : No Fall Risks  Follow up Falls evaluation completed     Most recent depression screenings:    07/30/2022   11:11 AM 05/12/2020    4:31 PM  PHQ 2/9 Scores  PHQ - 2 Score 3 0  PHQ- 9 Score 7 0        Patient Care Team: Agapito Games, MD as PCP - General (Family Medicine)   Outpatient Medications Prior to Visit  Medication Sig   allopurinol (ZYLOPRIM) 100 MG tablet Take 1.5 tablets (150 mg total) by mouth daily.   colchicine 0.6 MG tablet Take 1 tablet (0.6 mg total) by mouth daily. Start after complete the prednisone   metoprolol succinate (TOPROL-XL) 100 MG 24 hr tablet Take 1 tablet (100 mg total) by mouth in the morning and at bedtime. Take with or immediately following a meal.   spironolactone (ALDACTONE) 25 MG tablet Take 1 tablet (25 mg total) by mouth daily.   valsartan-hydrochlorothiazide (DIOVAN-HCT) 320-25 MG tablet Take 1 tablet by mouth daily.   No facility-administered medications prior to visit.    ROS        Objective:     BP (!) 103/57   Pulse 68   Ht 6' (1.829 m)   Wt 216 lb (98 kg)    SpO2 99%   BMI 29.29 kg/m    Physical Exam Vitals and nursing note reviewed.  Constitutional:      Appearance: Normal appearance.  HENT:     Head: Normocephalic and atraumatic.     Right Ear: Tympanic membrane, ear canal and external ear normal.     Left Ear: Tympanic membrane, ear canal and external ear normal.     Nose: Nose normal.     Mouth/Throat:     Pharynx: Oropharynx is clear.  Eyes:     Extraocular Movements: Extraocular movements intact.     Conjunctiva/sclera: Conjunctivae normal.     Pupils: Pupils are equal, round, and reactive to light.  Neck:     Thyroid: No thyromegaly.  Cardiovascular:     Rate and Rhythm: Normal rate and regular rhythm.  Pulmonary:     Effort: Pulmonary effort is normal.     Breath sounds: Normal breath sounds.  Abdominal:     General: Bowel sounds are normal.     Palpations: Abdomen is soft.     Tenderness: There is no abdominal tenderness.  Musculoskeletal:        General: No swelling.  Cervical back: Neck supple.  Skin:    General: Skin is warm and dry.  Neurological:     Mental Status: He is alert and oriented to person, place, and time.  Psychiatric:        Mood and Affect: Mood normal.        Behavior: Behavior normal.      No results found for any visits on 10/22/22.     Assessment & Plan:    Routine Health Maintenance and Physical Exam  Immunization History  Administered Date(s) Administered   PFIZER(Purple Top)SARS-COV-2 Vaccination 10/09/2019, 10/30/2019    Health Maintenance  Topic Date Due   DTaP/Tdap/Td (1 - Tdap) Never done   COVID-19 Vaccine (3 - 2023-24 season) 02/25/2023 (Originally 10/26/2021)   Hepatitis C Screening  02/25/2023 (Originally 12/06/1987)   Zoster Vaccines- Shingrix (1 of 2) 02/25/2023 (Originally 12/06/2019)   Colonoscopy  03/28/2023 (Originally 12/06/2014)   INFLUENZA VACCINE  05/26/2023 (Originally 09/26/2022)   HIV Screening  Completed   Pneumococcal Vaccine 21-49 Years old  Aged  Out   HPV VACCINES  Aged Out    Discussed health benefits of physical activity, and encouraged him to engage in regular exercise appropriate for his age and condition.  Problem List Items Addressed This Visit       Cardiovascular and Mediastinum   Essential hypertension, benign     Endocrine   IFG (impaired fasting glucose)    Plan to recheck in 6 months. Cut back on carbs and sweets.         Other   Hyperlipidemia    The 10-year ASCVD risk score (Arnett DK, et al., 2019) is: 10%   Values used to calculate the score:     Age: 37 years     Sex: Male     Is Non-Hispanic African American: No     Diabetic: No     Tobacco smoker: No     Systolic Blood Pressure: 151 mmHg     Is BP treated: Yes     HDL Cholesterol: 37 mg/dL     Total Cholesterol: 222 mg/dL  Discussed need for statin.       Other Visit Diagnoses     Wellness examination    -  Primary   Gastroesophageal reflux disease with esophagitis without hemorrhage           Keep up a regular exercise program and make sure you are eating a healthy diet Try to eat 4 servings of dairy a day, or if you are lactose intolerant take a calcium with vitamin D daily.  Your vaccines are up to date.   GERD -foods to avoid.  Recommend PPI such as Prilosec or Nexium nightly for 6 weeks to see if that helps.  On elevated A1c, liver function and cholesterol in 90 days.  He is gena make some changes between now and then.  Return in about 3 months (around 01/22/2023) for Pre-diabetes, cholestserol .     Nani Gasser, MD

## 2022-11-06 ENCOUNTER — Other Ambulatory Visit: Payer: Self-pay | Admitting: Family Medicine

## 2022-11-06 DIAGNOSIS — I10 Essential (primary) hypertension: Secondary | ICD-10-CM

## 2022-11-27 ENCOUNTER — Encounter: Payer: Self-pay | Admitting: Family Medicine

## 2022-11-27 DIAGNOSIS — M1A0790 Idiopathic chronic gout, unspecified ankle and foot, without tophus (tophi): Secondary | ICD-10-CM

## 2022-11-27 MED ORDER — COLCHICINE 0.6 MG PO TABS
0.6000 mg | ORAL_TABLET | Freq: Two times a day (BID) | ORAL | 1 refills | Status: DC
Start: 2022-11-27 — End: 2023-05-14

## 2022-11-27 MED ORDER — PREDNISONE 20 MG PO TABS
40.0000 mg | ORAL_TABLET | Freq: Every day | ORAL | 0 refills | Status: DC
Start: 2022-11-27 — End: 2023-04-30

## 2022-11-27 NOTE — Telephone Encounter (Signed)
Meds ordered this encounter  Medications   predniSONE (DELTASONE) 20 MG tablet    Sig: Take 2 tablets (40 mg total) by mouth daily with breakfast.    Dispense:  10 tablet    Refill:  0   colchicine 0.6 MG tablet    Sig: Take 1 tablet (0.6 mg total) by mouth 2 (two) times daily. As needed for gout flare. Ok to decrease to one a day if diarrhea.    Dispense:  90 tablet    Refill:  1   Can take colchine daily for a month after the prednisone and then go back to using colchine PRN for flare.

## 2023-01-22 ENCOUNTER — Ambulatory Visit: Payer: 59 | Admitting: Family Medicine

## 2023-02-13 ENCOUNTER — Ambulatory Visit: Payer: 59 | Admitting: Family Medicine

## 2023-03-03 ENCOUNTER — Other Ambulatory Visit: Payer: Self-pay | Admitting: Family Medicine

## 2023-03-03 DIAGNOSIS — I10 Essential (primary) hypertension: Secondary | ICD-10-CM

## 2023-03-13 ENCOUNTER — Encounter: Payer: Self-pay | Admitting: Family Medicine

## 2023-03-13 DIAGNOSIS — F649 Gender identity disorder, unspecified: Secondary | ICD-10-CM

## 2023-04-21 ENCOUNTER — Telehealth: Payer: Self-pay | Admitting: Family Medicine

## 2023-04-21 DIAGNOSIS — R7301 Impaired fasting glucose: Secondary | ICD-10-CM

## 2023-04-21 DIAGNOSIS — M1A0790 Idiopathic chronic gout, unspecified ankle and foot, without tophus (tophi): Secondary | ICD-10-CM

## 2023-04-21 DIAGNOSIS — I10 Essential (primary) hypertension: Secondary | ICD-10-CM

## 2023-04-21 DIAGNOSIS — F649 Gender identity disorder, unspecified: Secondary | ICD-10-CM

## 2023-04-21 NOTE — Telephone Encounter (Signed)
 Pt appointment has been rescheduled to 05/27/23 at 8:10 am with Dr. Linford Arnold. Patient wants to know if he should get his labs done to check to see where his status is. Please advise.

## 2023-04-25 NOTE — Telephone Encounter (Signed)
 Can we put him on for March 19 in the 1130 slot if possible?  Okay to use acute slot.  Then let him know about the change in appointment.

## 2023-04-30 NOTE — Telephone Encounter (Signed)
 Please let him know that lab orders are in and can come in anytime.  Orders Placed This Encounter  Procedures   Testosterone, Free, Total, SHBG   CMP14+EGFR   Estradiol   Uric acid   Hemoglobin A1c

## 2023-05-05 ENCOUNTER — Ambulatory Visit: Payer: 59 | Admitting: Family Medicine

## 2023-05-12 ENCOUNTER — Encounter: Payer: Self-pay | Admitting: Family Medicine

## 2023-05-12 NOTE — Progress Notes (Signed)
 Kidney function is up just a little bit it looks like your baseline is usually 1.1-1.2 it was 1.3 this time so we will definitely keep an eye on it.  But you do look well-hydrated which is great.  The liver enzymes are still a little elevated but they actually do look better than they did 6 months ago so that is great.  A1c is still in the prediabetes range but it also looks better so great work.  Uric acid level ideally under 6 it is currently at 6.5 so just keep working on eating a low purine diet.  And we have baseline hormone levels to follow.

## 2023-05-14 ENCOUNTER — Ambulatory Visit: Admitting: Family Medicine

## 2023-05-14 VITALS — BP 129/79 | HR 64 | Ht 72.0 in | Wt 221.0 lb

## 2023-05-14 DIAGNOSIS — I10 Essential (primary) hypertension: Secondary | ICD-10-CM | POA: Diagnosis not present

## 2023-05-14 DIAGNOSIS — L989 Disorder of the skin and subcutaneous tissue, unspecified: Secondary | ICD-10-CM

## 2023-05-14 DIAGNOSIS — M1A0790 Idiopathic chronic gout, unspecified ankle and foot, without tophus (tophi): Secondary | ICD-10-CM | POA: Diagnosis not present

## 2023-05-14 DIAGNOSIS — F649 Gender identity disorder, unspecified: Secondary | ICD-10-CM

## 2023-05-14 MED ORDER — SPIRONOLACTONE 50 MG PO TABS: 50.0000 mg | ORAL_TABLET | Freq: Every day | ORAL | 1 refills | Status: DC

## 2023-05-14 MED ORDER — ALLOPURINOL 100 MG PO TABS: 100.0000 mg | ORAL_TABLET | Freq: Every day | ORAL | 3 refills | Status: DC

## 2023-05-14 NOTE — Assessment & Plan Note (Signed)
 Will bump up spiro. Still considering estrogen.  Encouraged to follow-up for therapy/counseling at least a couple more times before return in 2 months.

## 2023-05-14 NOTE — Assessment & Plan Note (Signed)
 Doing really well on just 1 tab of allopurinol instead of 1-1/2.  Gout flares in the last year.  Lab Results  Component Value Date   LABURIC 6.5 05/08/2023

## 2023-05-14 NOTE — Assessment & Plan Note (Signed)
 Pressure looks perfect today.  We are going to try bumping up the spironolactone for the anti-androgen effect.  So we will decrease metoprolol down to 1 tab daily instead of 2 continue with valsartan HCTZ will need to recheck potassium level in 2 weeks.  BMP ordered.

## 2023-05-14 NOTE — Patient Instructions (Addendum)
 Start spironolactone 50mg  and cut the metoprolol down to 1 tab daily.   Monitor BP at home. Goal still under 130.

## 2023-05-14 NOTE — Progress Notes (Signed)
 Established Patient Office Visit  Subjective  Patient ID: James Fisher, male    DOB: 12-Jun-1970  Age: 53 y.o. MRN: [id number]  Chief Complaint  Patient presents with   Medical Management of Chronic Issues    HPI  Lesion on left temple x 3 yr. Bleeds occ. Thought initially glasses were rubbing the area and changed glasses, etc no resolution. No itching.  [facility] to [facility] initial intake visit yesterday.  They recommended either counseling session or letter at next OV in 2 weeks. Would like to consider going up on the spironolactone.  Still considering estrogen therapy.  Still feeling a little conflicted in regards to making those changes that can i affect employment.  Etc.    ROS    Objective:     BP 129/79   Pulse 64   Ht 6' (1.829 m)   Wt 221 lb (100.2 kg)   SpO2 100%   BMI 29.97 kg/m    Physical Exam Vitals and nursing note reviewed.  Constitutional:      Appearance: Normal appearance.  HENT:     Head: Normocephalic and atraumatic.  Eyes:     Conjunctiva/sclera: Conjunctivae normal.  Cardiovascular:     Rate and Rhythm: Normal rate and regular rhythm.  Pulmonary:     Effort: Pulmonary effort is normal.     Breath sounds: Normal breath sounds.  Skin:    General: Skin is warm and dry.     Comments: Left temple there is an approximately 1.2 cm erythematous perfectly round area with a little bit of dried blood in the center.  Neurological:     Mental Status: He is alert.  Psychiatric:        Mood and Affect: Mood normal.      No results found for any visits on 05/14/23.    The 10-year ASCVD risk score (Arnett DK, et al., 2019) is: 8.3%    Assessment & Plan:   Problem List Items Addressed This Visit       Cardiovascular and Mediastinum   Essential hypertension, benign   Pressure looks perfect today.  We are going to try bumping up the spironolactone for the anti-androgen effect.  So we will decrease metoprolol down to 1 tab daily instead  of 2 continue with valsartan HCTZ will need to recheck potassium level in 2 weeks.  BMP ordered.      Relevant Medications   spironolactone (ALDACTONE) 50 MG tablet   Other Relevant Orders   Basic Metabolic Panel (BMET)     Musculoskeletal and Integument   Gout of foot   Doing really well on just 1 tab of allopurinol instead of 1-1/2.  Gout flares in the last year.  Lab Results  Component Value Date   LABURIC 6.5 05/08/2023         Relevant Medications   allopurinol (ZYLOPRIM) 100 MG tablet     Other   Gender dysphoria - Primary   Will bump up spiro. Still considering estrogen.  Encouraged to follow-up for therapy/counseling at least a couple more times before return in 2 months.      Other Visit Diagnoses       Skin lesion of face       Relevant Orders   Ambulatory referral to Dermatology       Given lesion not healing x 3 years-suspicious for squamous cell.  Will place referral to dermatology for biopsy.  Return in about 2 months (around 07/14/2023).  Nani Gasser, MD

## 2023-05-15 LAB — CMP14+EGFR
ALT: 63 IU/L — ABNORMAL HIGH (ref 0–44)
AST: 48 IU/L — ABNORMAL HIGH (ref 0–40)
Albumin: 4.9 g/dL (ref 3.8–4.9)
Alkaline Phosphatase: 103 IU/L (ref 44–121)
BUN/Creatinine Ratio: 8 — ABNORMAL LOW (ref 9–20)
BUN: 10 mg/dL (ref 6–24)
Bilirubin Total: 1 mg/dL (ref 0.0–1.2)
CO2: 24 mmol/L (ref 20–29)
Calcium: 9.7 mg/dL (ref 8.7–10.2)
Chloride: 97 mmol/L (ref 96–106)
Creatinine, Ser: 1.31 mg/dL — ABNORMAL HIGH (ref 0.76–1.27)
Globulin, Total: 2 g/dL (ref 1.5–4.5)
Glucose: 88 mg/dL (ref 70–99)
Potassium: 4.5 mmol/L (ref 3.5–5.2)
Sodium: 136 mmol/L (ref 134–144)
Total Protein: 6.9 g/dL (ref 6.0–8.5)
eGFR: 65 mL/min/{1.73_m2} (ref >59)

## 2023-05-15 LAB — ESTRADIOL: Estradiol: 13.4 pg/mL (ref 7.6–42.6)

## 2023-05-15 LAB — TESTOSTERONE, FREE, TOTAL, SHBG
Sex Hormone Binding: 25.6 nmol/L (ref 19.3–76.4)
Testosterone, Free: 10.4 pg/mL (ref 7.2–24.0)
Testosterone: 422 ng/dL (ref 264–916)

## 2023-05-15 LAB — URIC ACID: Uric Acid: 6.5 mg/dL (ref 3.8–8.4)

## 2023-05-15 LAB — HEMOGLOBIN A1C
Est. average glucose Bld gHb Est-mCnc: 117 mg/dL
Hgb A1c MFr Bld: 5.7 % — ABNORMAL HIGH (ref 4.8–5.6)

## 2023-05-21 ENCOUNTER — Other Ambulatory Visit (HOSPITAL_COMMUNITY): Payer: Self-pay

## 2023-05-27 ENCOUNTER — Ambulatory Visit: Payer: 59 | Admitting: Family Medicine

## 2023-05-28 ENCOUNTER — Encounter: Payer: Self-pay | Admitting: Family Medicine

## 2023-06-02 ENCOUNTER — Other Ambulatory Visit: Payer: Self-pay | Admitting: Family Medicine

## 2023-06-02 DIAGNOSIS — I10 Essential (primary) hypertension: Secondary | ICD-10-CM

## 2023-06-12 ENCOUNTER — Encounter: Payer: Self-pay | Admitting: Family Medicine

## 2023-07-17 ENCOUNTER — Ambulatory Visit: Admitting: Family Medicine

## 2023-08-08 ENCOUNTER — Other Ambulatory Visit: Payer: Self-pay | Admitting: Family Medicine

## 2023-08-08 DIAGNOSIS — I10 Essential (primary) hypertension: Secondary | ICD-10-CM

## 2023-08-25 ENCOUNTER — Encounter: Payer: Self-pay | Admitting: Family Medicine

## 2023-08-25 ENCOUNTER — Ambulatory Visit (INDEPENDENT_AMBULATORY_CARE_PROVIDER_SITE_OTHER): Admitting: Family Medicine

## 2023-08-25 VITALS — BP 110/60 | HR 66 | Ht 72.0 in | Wt 220.2 lb

## 2023-08-25 DIAGNOSIS — M1A0790 Idiopathic chronic gout, unspecified ankle and foot, without tophus (tophi): Secondary | ICD-10-CM

## 2023-08-25 DIAGNOSIS — I10 Essential (primary) hypertension: Secondary | ICD-10-CM | POA: Diagnosis not present

## 2023-08-25 DIAGNOSIS — R7301 Impaired fasting glucose: Secondary | ICD-10-CM | POA: Diagnosis not present

## 2023-08-25 DIAGNOSIS — R109 Unspecified abdominal pain: Secondary | ICD-10-CM

## 2023-08-25 LAB — POCT GLYCOSYLATED HEMOGLOBIN (HGB A1C): Hemoglobin A1C: 5.4 % (ref 4.0–5.6)

## 2023-08-25 MED ORDER — ALLOPURINOL 100 MG PO TABS: 100.0000 mg | ORAL_TABLET | Freq: Every day | ORAL | 3 refills | Status: DC

## 2023-08-25 MED ORDER — METOPROLOL SUCCINATE ER 100 MG PO TB24: 100.0000 mg | ORAL_TABLET | Freq: Every day | ORAL | 3 refills | Status: DC

## 2023-08-25 MED ORDER — VALSARTAN-HYDROCHLOROTHIAZIDE 320-25 MG PO TABS: 1.0000 | ORAL_TABLET | Freq: Every day | ORAL | 3 refills | Status: DC

## 2023-08-25 NOTE — Assessment & Plan Note (Signed)
 Will correct the allopurinol  prescription he actually takes 150 mg daily so that is 135 tabs for every 90 days.

## 2023-08-25 NOTE — Progress Notes (Signed)
 Established Patient Office Visit  Subjective  Patient ID: James Fisher, male    DOB: [birth year]  Age: 53 y.o. MRN: [id number]  Chief Complaint  Patient presents with   Hypertension   ifg    HPI  He is also concerned because starting about 5 to 6 weeks ago he started having some right sided pain above the hip area close to the bottom of the rib cage area he says initially it happened on the weekends the night after he had barbecued and had some beer he woke up and it would hurt the next day the rest of the week it felt fine then the following weekend same thing he barbecued had some beer the next day woke up and had pain but over the last week and a half his pain has been persistent and it has been almost every day.  He denies any fevers or chills.  On Saturday night he actually had some nausea vomiting and diarrhea but no more vomiting since then.  Bowel movements have been back-and-forth he feels like they have been a little bit more sluggish but then will have some loose stools.  He feels like the pain is worse if he lays on that side but not as bad if he lays on the left side.  1 day he did bend over to pick something up and stood up quickly and felt a little lightheaded.  He not really had that happen before.  Pain is not worse with movement.  Gout-his allopurinol  was only filled for about 15 days and would like to get an updated 90-day prescription.  Hypertension- Pt denies chest pain, SOB, dizziness, or heart palpitations.  Taking meds as directed w/o problems.  Denies medication side effects.    Impaired fasting glucose-no increased thirst or urination. No symptoms consistent with hypoglycemia.    ROS    Objective:     BP 110/60   Pulse 66   Ht 6' (1.829 m)   Wt 220 lb 3.2 oz (99.9 kg)   SpO2 98%   BMI 29.86 kg/m    Physical Exam Vitals and nursing note reviewed.  Constitutional:      Appearance: Normal appearance.  HENT:     Head: Normocephalic and atraumatic.    Eyes:     Conjunctiva/sclera: Conjunctivae normal.    Cardiovascular:     Rate and Rhythm: Normal rate and regular rhythm.  Pulmonary:     Effort: Pulmonary effort is normal.     Breath sounds: Normal breath sounds.  Abdominal:     General: There is no distension.     Palpations: Abdomen is soft.     Tenderness: There is abdominal tenderness. There is no guarding.     Comments: Tender in the left mid abdomen mild tenderness in the right mid abdomen and very tender in the right lower quadrant.  Some slight tenderness just above the hip bone area   Skin:    General: Skin is warm and dry.   Neurological:     Mental Status: He is alert.   Psychiatric:        Mood and Affect: Mood normal.      Results for orders placed or performed in visit on 08/25/23  POCT HgB A1C  Result Value Ref Range   Hemoglobin A1C 5.4 4.0 - 5.6 %   HbA1c POC (<> result, manual entry)     HbA1c, POC (prediabetic range)     HbA1c, POC (controlled diabetic range)  The 10-year ASCVD risk score (Arnett DK, et al., 2019) is: 6.3%    Assessment & Plan:   Problem List Items Addressed This Visit       Cardiovascular and Mediastinum   Essential hypertension, benign - Primary   Blood pressure little on the lower side today but again he has not been eating much since he had the nausea vomiting and diarrhea over the weekend.  Just make sure staying well-hydrated and keep an eye on the blood pressure      Relevant Medications   valsartan -hydrochlorothiazide  (DIOVAN -HCT) 320-25 MG tablet   metoprolol  succinate (TOPROL -XL) 100 MG 24 hr tablet   Other Relevant Orders   Lipase   CMP14+EGFR   CBC with Differential/Platelet     Endocrine   IFG (impaired fasting glucose)   A1c looks phenomenal at 5.4 today continue current regimen he is doing great he has cut back on his food intake.      Relevant Orders   POCT HgB A1C (Completed)   Lipase   CMP14+EGFR   CBC with Differential/Platelet      Musculoskeletal and Integument   Gout of foot   Will correct the allopurinol  prescription he actually takes 150 mg daily so that is 135 tabs for every 90 days.      Relevant Medications   allopurinol  (ZYLOPRIM ) 100 MG tablet   Other Relevant Orders   Lipase   CMP14+EGFR   CBC with Differential/Platelet   Other Visit Diagnoses       Right lateral abdominal pain       Relevant Orders   Lipase   CMP14+EGFR   CBC with Differential/Platelet       Right lateral abdominal pain-unclear etiology.  Consider acute hepatitis consider kidney issues or even appendicitis.  Because of the nausea and vomiting we will also check a lipase will check for liver function.  Will also check a blood count to evaluate for inflammation and infection will call with results once available in the meantime just take with a bland diet alcohol definitely seems to be a trigger so I would definitely avoid that as well.  Return in about 6 months (around 02/24/2024) for bp/ifg.    Dorothyann Byars, MD

## 2023-08-25 NOTE — Assessment & Plan Note (Signed)
 Blood pressure little on the lower side today but again he has not been eating much since he had the nausea vomiting and diarrhea over the weekend.  Just make sure staying well-hydrated and keep an eye on the blood pressure

## 2023-08-25 NOTE — Assessment & Plan Note (Signed)
 A1c looks phenomenal at 5.4 today continue current regimen he is doing great he has cut back on his food intake.

## 2023-08-26 ENCOUNTER — Ambulatory Visit: Payer: Self-pay | Admitting: Family Medicine

## 2023-08-26 ENCOUNTER — Encounter: Payer: Self-pay | Admitting: Family Medicine

## 2023-08-26 ENCOUNTER — Other Ambulatory Visit: Payer: Self-pay | Admitting: Family Medicine

## 2023-08-26 DIAGNOSIS — R109 Unspecified abdominal pain: Secondary | ICD-10-CM

## 2023-08-26 DIAGNOSIS — R1031 Right lower quadrant pain: Secondary | ICD-10-CM

## 2023-08-26 LAB — CMP14+EGFR
ALT: 59 IU/L — ABNORMAL HIGH (ref 0–44)
AST: 46 IU/L — ABNORMAL HIGH (ref 0–40)
Albumin: 4.7 g/dL (ref 3.8–4.9)
Alkaline Phosphatase: 102 IU/L (ref 44–121)
BUN/Creatinine Ratio: 11 (ref 9–20)
BUN: 14 mg/dL (ref 6–24)
Bilirubin Total: 1.1 mg/dL (ref 0.0–1.2)
CO2: 21 mmol/L (ref 20–29)
Calcium: 10.5 mg/dL — ABNORMAL HIGH (ref 8.7–10.2)
Chloride: 94 mmol/L — ABNORMAL LOW (ref 96–106)
Creatinine, Ser: 1.22 mg/dL (ref 0.76–1.27)
Globulin, Total: 2.3 g/dL (ref 1.5–4.5)
Glucose: 82 mg/dL (ref 70–99)
Potassium: 4.4 mmol/L (ref 3.5–5.2)
Sodium: 134 mmol/L (ref 134–144)
Total Protein: 7 g/dL (ref 6.0–8.5)
eGFR: 71 mL/min/{1.73_m2} (ref >59)

## 2023-08-26 LAB — CBC WITH DIFFERENTIAL/PLATELET
Basophils Absolute: 0.1 10*3/uL (ref 0.0–0.2)
Basos: 1 %
EOS (ABSOLUTE): 0.2 10*3/uL (ref 0.0–0.4)
Eos: 3 %
Hematocrit: 46.3 % (ref 37.5–51.0)
Hemoglobin: 15.9 g/dL (ref 13.0–17.7)
Immature Grans (Abs): 0.1 10*3/uL (ref 0.0–0.1)
Immature Granulocytes: 1 %
Lymphocytes Absolute: 3 10*3/uL (ref 0.7–3.1)
Lymphs: 35 %
MCH: 33.7 pg — ABNORMAL HIGH (ref 26.6–33.0)
MCHC: 34.3 g/dL (ref 31.5–35.7)
MCV: 98 fL — ABNORMAL HIGH (ref 79–97)
Monocytes Absolute: 1 10*3/uL — ABNORMAL HIGH (ref 0.1–0.9)
Monocytes: 11 %
Neutrophils Absolute: 4.3 10*3/uL (ref 1.4–7.0)
Neutrophils: 49 %
Platelets: 249 10*3/uL (ref 150–450)
RBC: 4.72 x10E6/uL (ref 4.14–5.80)
RDW: 12.8 % (ref 11.6–15.4)
WBC: 8.6 10*3/uL (ref 3.4–10.8)

## 2023-08-26 LAB — LIPASE: Lipase: 64 U/L (ref 13–78)

## 2023-08-26 NOTE — Progress Notes (Signed)
 No pancreatitis. Your liver function is still up but better than last fall.  No sign of acute infection.  Moving forward with CT order

## 2023-08-26 NOTE — Progress Notes (Signed)
 Orders Placed This Encounter  Procedures   CT ABDOMEN PELVIS W CONTRAST    Standing Status:   Future    Expiration Date:   08/25/2024    If indicated for the ordered procedure, I authorize the administration of contrast media per Radiology protocol:   Yes    Does the patient have a contrast media/X-ray dye allergy?:   No    Preferred imaging location?:   [facility]    If indicated for the ordered procedure, I authorize the administration of oral contrast media per Radiology protocol:   Yes

## 2023-08-31 ENCOUNTER — Other Ambulatory Visit: Payer: Self-pay | Admitting: *Deleted

## 2023-08-31 DIAGNOSIS — I10 Essential (primary) hypertension: Secondary | ICD-10-CM

## 2023-08-31 MED ORDER — METOPROLOL SUCCINATE ER 100 MG PO TB24: 100.0000 mg | ORAL_TABLET | Freq: Every day | ORAL | 3 refills | Status: DC

## 2023-09-01 ENCOUNTER — Ambulatory Visit

## 2023-09-01 ENCOUNTER — Ambulatory Visit: Payer: Self-pay | Admitting: Family Medicine

## 2023-09-01 DIAGNOSIS — K2100 Gastro-esophageal reflux disease with esophagitis, without bleeding: Secondary | ICD-10-CM

## 2023-09-01 DIAGNOSIS — R1031 Right lower quadrant pain: Secondary | ICD-10-CM

## 2023-09-01 DIAGNOSIS — R109 Unspecified abdominal pain: Secondary | ICD-10-CM

## 2023-09-01 MED ORDER — IOHEXOL 300 MG/ML  SOLN
100.0000 mL | Freq: Once | INTRAMUSCULAR | Status: AC | PRN
  Administered 2023-09-01: 100 mL via INTRAVENOUS

## 2023-09-01 NOTE — Progress Notes (Signed)
 Hi Cain, good news is that CT of the abdomen actually looks okay.  The liver does look like it has increased fatty content.  But no swelling or mass.  Gallbladder actually looks okay as well.  Pancreatic duct also looks normal.  Spleen looks good.  Give us  very small left groin hernia but not causing any problems.  If you are still having pain in that right upper quadrant let me know.  We may need to consider getting you in with a gastroenterologist for further workup.  In the meantime to improve fatty liver a lot of it is dietary reducing a lot of high carb, high processed foods.  Reducing red meat intake.  following more of a Mediterranean diet is helpful.  Regular exercise improves fatty liver as well.

## 2023-09-02 ENCOUNTER — Other Ambulatory Visit: Payer: Self-pay | Admitting: *Deleted

## 2023-09-02 DIAGNOSIS — I10 Essential (primary) hypertension: Secondary | ICD-10-CM

## 2023-09-02 MED ORDER — PANTOPRAZOLE SODIUM 40 MG PO TBEC: 40.0000 mg | DELAYED_RELEASE_TABLET | Freq: Two times a day (BID) | ORAL | 1 refills | Status: DC

## 2023-09-02 MED ORDER — METOPROLOL SUCCINATE ER 100 MG PO TB24: 100.0000 mg | ORAL_TABLET | Freq: Every day | ORAL | 3 refills | Status: DC

## 2023-09-07 ENCOUNTER — Encounter: Payer: Self-pay | Admitting: Family Medicine

## 2023-09-07 DIAGNOSIS — M1A0790 Idiopathic chronic gout, unspecified ankle and foot, without tophus (tophi): Secondary | ICD-10-CM

## 2023-09-07 DIAGNOSIS — I10 Essential (primary) hypertension: Secondary | ICD-10-CM

## 2023-09-08 MED ORDER — VALSARTAN-HYDROCHLOROTHIAZIDE 320-25 MG PO TABS: 1.0000 | ORAL_TABLET | Freq: Every day | ORAL | 3 refills | Status: AC

## 2023-09-08 MED ORDER — ALLOPURINOL 100 MG PO TABS: 100.0000 mg | ORAL_TABLET | Freq: Every day | ORAL | 3 refills | Status: AC

## 2023-09-08 MED ORDER — METOPROLOL SUCCINATE ER 100 MG PO TB24: 100.0000 mg | ORAL_TABLET | Freq: Every day | ORAL | 3 refills | Status: AC

## 2023-09-08 MED ORDER — SPIRONOLACTONE 50 MG PO TABS: 50.0000 mg | ORAL_TABLET | Freq: Every day | ORAL | 1 refills | Status: DC

## 2024-01-07 ENCOUNTER — Encounter: Payer: Self-pay | Admitting: Family Medicine

## 2024-01-07 NOTE — Telephone Encounter (Signed)
 Ok for work note for 2 days

## 2024-02-23 ENCOUNTER — Ambulatory Visit: Admitting: Family Medicine

## 2024-03-09 ENCOUNTER — Other Ambulatory Visit: Payer: Self-pay | Admitting: Family Medicine

## 2024-03-09 DIAGNOSIS — I10 Essential (primary) hypertension: Secondary | ICD-10-CM

## 2024-03-15 ENCOUNTER — Ambulatory Visit: Admitting: Family Medicine

## 2024-03-15 ENCOUNTER — Encounter: Payer: Self-pay | Admitting: Family Medicine

## 2024-03-15 VITALS — BP 104/56 | HR 65 | Ht 72.0 in | Wt 222.0 lb

## 2024-03-15 DIAGNOSIS — I10 Essential (primary) hypertension: Secondary | ICD-10-CM

## 2024-03-15 DIAGNOSIS — M1A0790 Idiopathic chronic gout, unspecified ankle and foot, without tophus (tophi): Secondary | ICD-10-CM

## 2024-03-15 DIAGNOSIS — S8391XA Sprain of unspecified site of right knee, initial encounter: Secondary | ICD-10-CM

## 2024-03-15 DIAGNOSIS — K2100 Gastro-esophageal reflux disease with esophagitis, without bleeding: Secondary | ICD-10-CM

## 2024-03-15 DIAGNOSIS — R7301 Impaired fasting glucose: Secondary | ICD-10-CM

## 2024-03-15 DIAGNOSIS — M25561 Pain in right knee: Secondary | ICD-10-CM

## 2024-03-15 LAB — POCT GLYCOSYLATED HEMOGLOBIN (HGB A1C): Hemoglobin A1C: 5.7 % — AB (ref 4.0–5.6)

## 2024-03-15 MED ORDER — PREDNISONE 20 MG PO TABS: ORAL_TABLET | ORAL | 1 refills | Status: AC

## 2024-03-15 MED ORDER — SPIRONOLACTONE 50 MG PO TABS: 50.0000 mg | ORAL_TABLET | Freq: Every day | ORAL | 3 refills | Status: AC

## 2024-03-15 MED ORDER — PANTOPRAZOLE SODIUM 40 MG PO TBEC: 40.0000 mg | DELAYED_RELEASE_TABLET | Freq: Two times a day (BID) | ORAL | 1 refills | Status: AC

## 2024-03-15 NOTE — Assessment & Plan Note (Signed)
" °  Essential hypertension Blood pressure well-controlled with spironolactone  and valsartan . - Continue current antihypertensive regimen. - Refilled spironolactone  prescription. "

## 2024-03-15 NOTE — Progress Notes (Signed)
 "  Established Patient Office Visit  Patient ID: James Fisher, male    DOB: 12-02-1970  Age: 54 y.o. MRN: [id number] PCP: Alvan James BIRCH, MD  Chief Complaint  Patient presents with   Hypertension   ifg    Subjective:     HPI  Discussed the use of AI scribe software for clinical note transcription with the patient, who gave verbal consent to proceed.  History of Present Illness James Fisher is a 54 year old male who presents with a knee injury sustained while assisting his father.  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 - Using previously prescribed knee brace to keep leg straight - Takes Aleve for knee pain  History of prior knee injury - Previous knee injury approximately two and a half years ago - Wore a brace at that time due to swelling - No current swelling  Prediabetes - A1c of 5.7  Hypertension - Currently taking valsartan  and spironolactone  for blood pressure management - Medications taken at 4:30 AM and 10:30 PM due to night shift work schedule  Gout - Has prednisone  available for emergency gout flare-ups, current rx has expired     ROS    Objective:     BP (!) 104/56   Pulse 65   Ht 6' (1.829 m)   Wt 222 lb (100.7 kg)   SpO2 99%   BMI 30.11 kg/m    Physical Exam Vitals and nursing note reviewed.  Constitutional:      Appearance: Normal appearance.  HENT:     Head: Normocephalic and atraumatic.  Eyes:     Conjunctiva/sclera: Conjunctivae normal.  Cardiovascular:     Rate and Rhythm: Normal rate and regular rhythm.  Pulmonary:     Effort: Pulmonary effort is normal.     Breath sounds: Normal breath sounds.  Skin:    General: Skin is  warm and dry.  Neurological:     Mental Status: He is alert.  Psychiatric:        Mood and Affect: Mood normal.      Results for orders placed or performed in visit on 03/15/24  POCT HgB A1C  Result Value Ref Range   Hemoglobin A1C 5.7 (A) 4.0 - 5.6 %   HbA1c POC (<> result, manual entry)     HbA1c, POC (prediabetic range)     HbA1c, POC (controlled diabetic range)        The 10-year ASCVD risk score (Arnett DK, et al., 2019) is: 6.2%    Assessment & Plan:   Problem List Items Addressed This Visit       Cardiovascular and Mediastinum   Essential hypertension, benign - Primary    Essential hypertension Blood pressure well-controlled with spironolactone  and valsartan . - Continue current antihypertensive regimen. - Refilled spironolactone  prescription.      Relevant Medications   spironolactone  (ALDACTONE ) 50 MG tablet   Other Relevant Orders   POCT HgB A1C (Completed)   CMP14+EGFR   CBC with Differential/Platelet     Endocrine   IFG (impaired fasting glucose)    Impaired fasting glucose A1c at 5.7%, indicating prediabetes with satisfactory control. - Continue monitoring A1c levels.      Relevant  Orders   POCT HgB A1C (Completed)   CMP14+EGFR   CBC with Differential/Platelet     Musculoskeletal and Integument   Gout of foot   Gout Managed with allopurinol . - Prescribed new prednisone  prescription for emergency flare-ups.       Relevant Medications   predniSONE  (DELTASONE ) 20 MG tablet   Other Visit Diagnoses       Gastroesophageal reflux disease with esophagitis without hemorrhage       Relevant Medications   pantoprazole  (PROTONIX ) 40 MG tablet     Acute pain of right knee       Relevant Orders   Ambulatory referral to Sports Medicine     Sprain of right knee, unspecified ligament, initial encounter           Assessment and Plan Assessment & Plan Right knee sprain Acute sprain with pain, possible ACL or cartilage tear. - Referred to  orthopedic specialist for evaluation. - Advised use of knee brace ( he has one from prior injury). - Recommended elevation and ice application. - Instructed on gentle range of motion exercises. - Prescribed Aleve 2 tablets twice daily for 5 days.      Return in about 6 months (around 09/12/2024) for Hypertension, Pre-diabetes.    James Byars, MD [facility]   "

## 2024-03-15 NOTE — Assessment & Plan Note (Signed)
" °  Impaired fasting glucose A1c at 5.7%, indicating prediabetes with satisfactory control. - Continue monitoring A1c levels. "

## 2024-03-15 NOTE — Assessment & Plan Note (Signed)
 Gout Managed with allopurinol . - Prescribed new prednisone  prescription for emergency flare-ups.

## 2024-03-16 ENCOUNTER — Ambulatory Visit: Payer: Self-pay | Admitting: Family Medicine

## 2024-03-16 DIAGNOSIS — R899 Unspecified abnormal finding in specimens from other organs, systems and tissues: Secondary | ICD-10-CM

## 2024-03-16 LAB — CBC WITH DIFFERENTIAL/PLATELET
Basophils Absolute: 0.1 x10E3/uL (ref 0.0–0.2)
Basos: 1 %
EOS (ABSOLUTE): 0.2 x10E3/uL (ref 0.0–0.4)
Eos: 2 %
Hematocrit: 43.1 % (ref 37.5–51.0)
Hemoglobin: 14.7 g/dL (ref 13.0–17.7)
Immature Grans (Abs): 0 x10E3/uL (ref 0.0–0.1)
Immature Granulocytes: 0 %
Lymphocytes Absolute: 2.2 x10E3/uL (ref 0.7–3.1)
Lymphs: 27 %
MCH: 32.7 pg (ref 26.6–33.0)
MCHC: 34.1 g/dL (ref 31.5–35.7)
MCV: 96 fL (ref 79–97)
Monocytes Absolute: 0.7 x10E3/uL (ref 0.1–0.9)
Monocytes: 9 %
Neutrophils Absolute: 4.9 x10E3/uL (ref 1.4–7.0)
Neutrophils: 60 %
Platelets: 236 x10E3/uL (ref 150–450)
RBC: 4.5 x10E6/uL (ref 4.14–5.80)
RDW: 12.5 % (ref 11.6–15.4)
WBC: 8.1 x10E3/uL (ref 3.4–10.8)

## 2024-03-16 LAB — CMP14+EGFR
ALT: 68 IU/L — ABNORMAL HIGH (ref 0–44)
AST: 54 IU/L — ABNORMAL HIGH (ref 0–40)
Albumin: 4.7 g/dL (ref 3.8–4.9)
Alkaline Phosphatase: 96 IU/L (ref 47–123)
BUN/Creatinine Ratio: 14 (ref 9–20)
BUN: 16 mg/dL (ref 6–24)
Bilirubin Total: 1 mg/dL (ref 0.0–1.2)
CO2: 22 mmol/L (ref 20–29)
Calcium: 9.7 mg/dL (ref 8.7–10.2)
Chloride: 93 mmol/L — ABNORMAL LOW (ref 96–106)
Creatinine, Ser: 1.18 mg/dL (ref 0.76–1.27)
Globulin, Total: 2.2 g/dL (ref 1.5–4.5)
Glucose: 125 mg/dL — ABNORMAL HIGH (ref 70–99)
Potassium: 5.3 mmol/L — ABNORMAL HIGH (ref 3.5–5.2)
Sodium: 129 mmol/L — ABNORMAL LOW (ref 134–144)
Total Protein: 6.9 g/dL (ref 6.0–8.5)
eGFR: 73 mL/min/1.73

## 2024-03-16 NOTE — Progress Notes (Signed)
 Good morning, kidney function looks stable at 1.1.  For some reason the sodium level was down and the potassium level was up which is a little odd.  Usually your sodium levels are normal.  Sawed like to recheck that later this week.  I am not sure if it is just a lab draw error or if there is something else going on.  Liver enzymes are still mildly elevated again continue to work on increased vegetables and lean proteins.  Following a more Mediterranean style diet.  Your blood count looks great though no sign of anemia or infection.

## 2024-03-17 NOTE — Telephone Encounter (Signed)
 No symptoms with these levels. OK for labs

## 2024-03-22 ENCOUNTER — Telehealth: Payer: Self-pay

## 2024-03-22 NOTE — Telephone Encounter (Signed)
 Greeting, James Fisher. The following patient sent a message stating he has not heard from [facility] for scheduling. It looks like they tried to reach him but there was no answer. Can you assist with this request. Thanks in advance.

## 2024-03-23 NOTE — Telephone Encounter (Signed)
 Noted! Thank you

## 2024-09-13 ENCOUNTER — Ambulatory Visit: Admitting: Family Medicine
